# Patient Record
Sex: Female | Born: 1950 | Race: Black or African American | Hispanic: No | Marital: Married | Smoking: Former smoker
Health system: Southern US, Community
[De-identification: ages and names within clinical notes are randomized; demographics above are authoritative.]

---

## 2009-02-26 ENCOUNTER — Encounter: Admission: RE | Admit: 2009-02-26 | Discharge: 2009-02-26 | Payer: Self-pay | Admitting: Neurosurgery

## 2009-02-26 IMAGING — RF DG MYELOGRAM CERVICAL
10 series · 10 of 10 positions shown · IV contrast (omnipaque)
Comparison: Outside MRI from [HOSPITAL] [DATE].

MYELOGRAM  INJECTION
TECHNIQUE: Informed consent was obtained from the patient prior to
the procedure, including potential complications of headache,
allergy, infection and pain. Specific instructions were given
regarding 24 hour bedrest post procedure to prevent post-LP
headache.  A timeout procedure was performed.  With the patient
prone, the lower back was prepped with Betadine.  1% Lidocaine was
used for local anesthesia.  Lumbar puncture was performed by the
radiologist at the L2-[G9] using a 22 gauge needle with return of
clear CSF.  Ninecc of Omnipaque [G9] injected into the
subarachnoid space .
CLINICAL DATA: Neck and right shoulder and arm pain. On the job
injury.  Diminished range of motion.
TECHNIQUE: Multidetector CT imaging of the cervical spine was
performed following myelography.  Multiplanar CT image
reconstructions were also generated.

[Series 1: myelogram  white · 1 of 1 slices shown (1 of 10)]
[im 1/1]
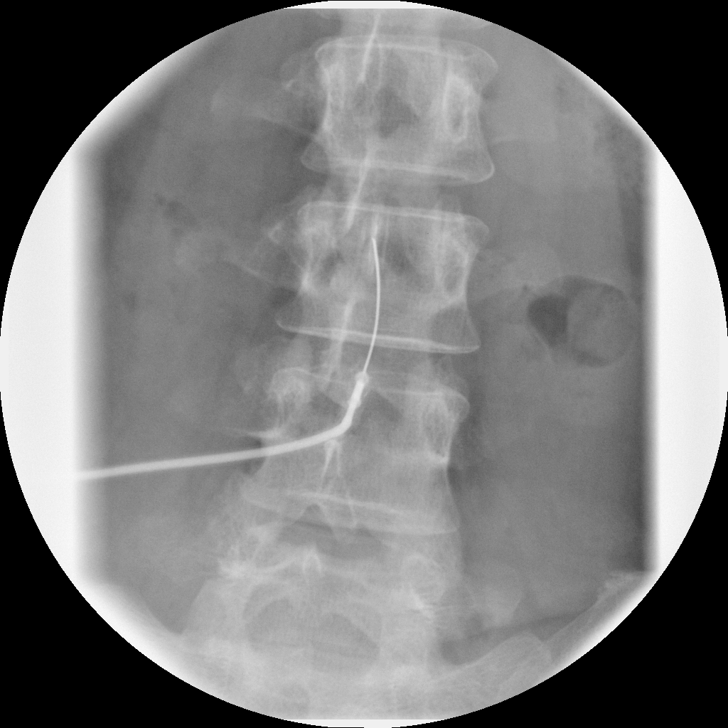

[Series 2: myelogram  white · 1 of 1 slices shown (2 of 10)]
[im 1/1]
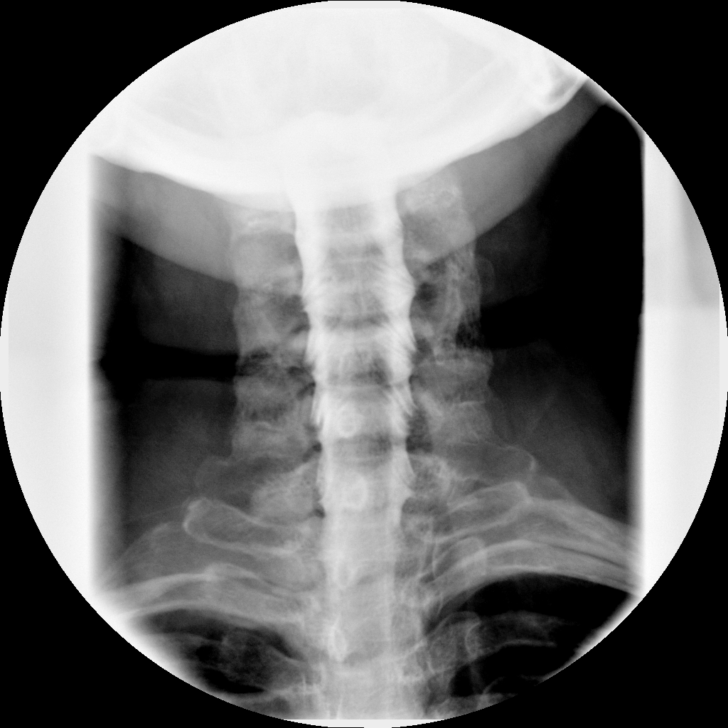

[Series 3: myelogram  white · 1 of 1 slices shown (3 of 10)]
[im 1/1]
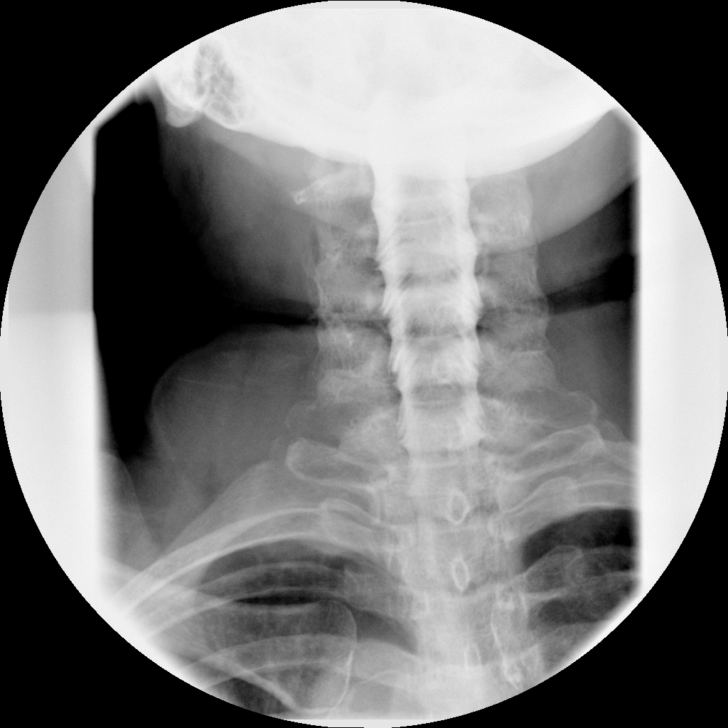

[Series 4: myelogram  white · 1 of 1 slices shown (4 of 10)]
[im 1/1]
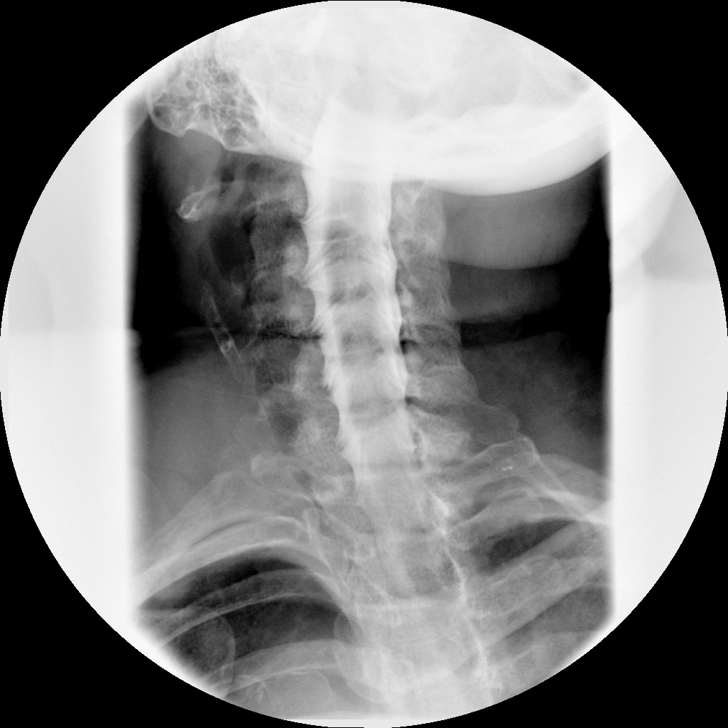

[Series 5: myelogram  white · 1 of 1 slices shown (5 of 10)]
[im 1/1]
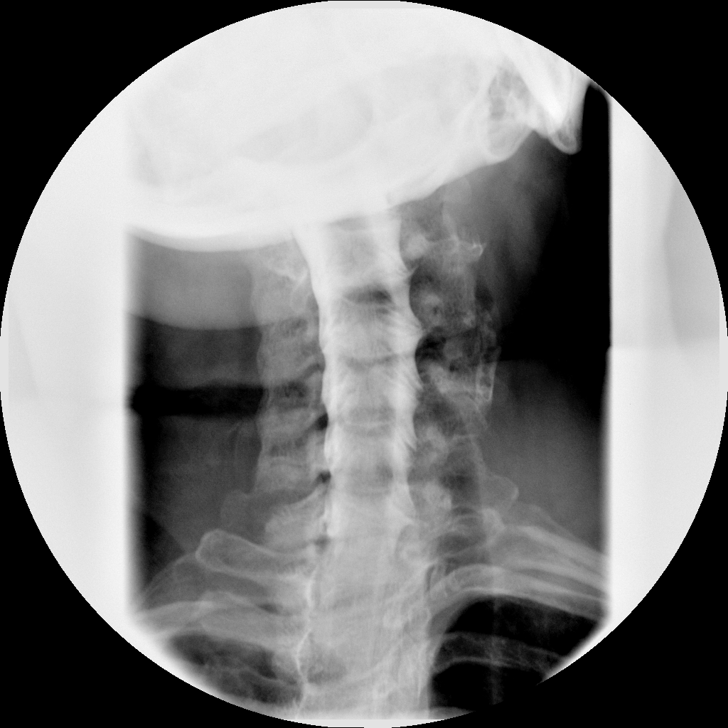

[Series 6: myelogram  white · 1 of 1 slices shown (6 of 10)]
[im 1/1]
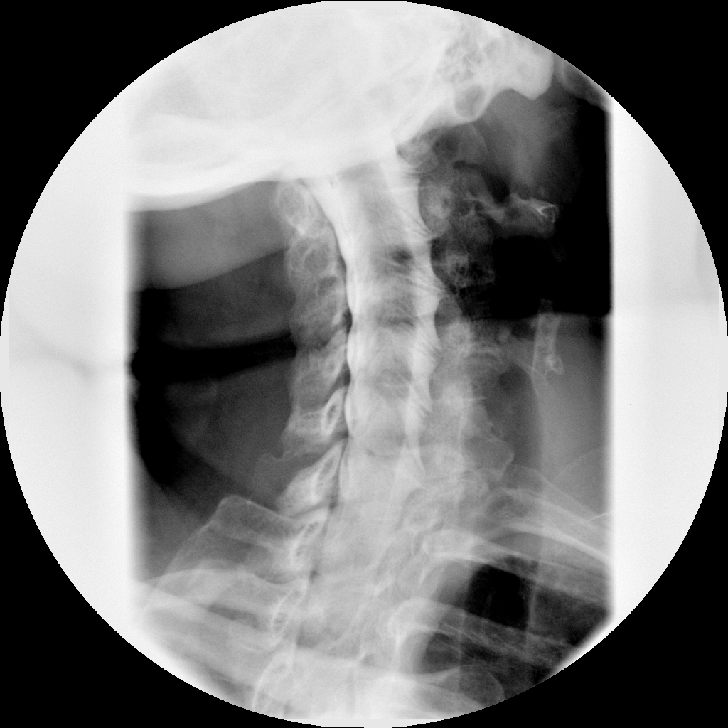

[Series 7: myelogram  white · 1 of 1 slices shown (7 of 10)]
[im 1/1]
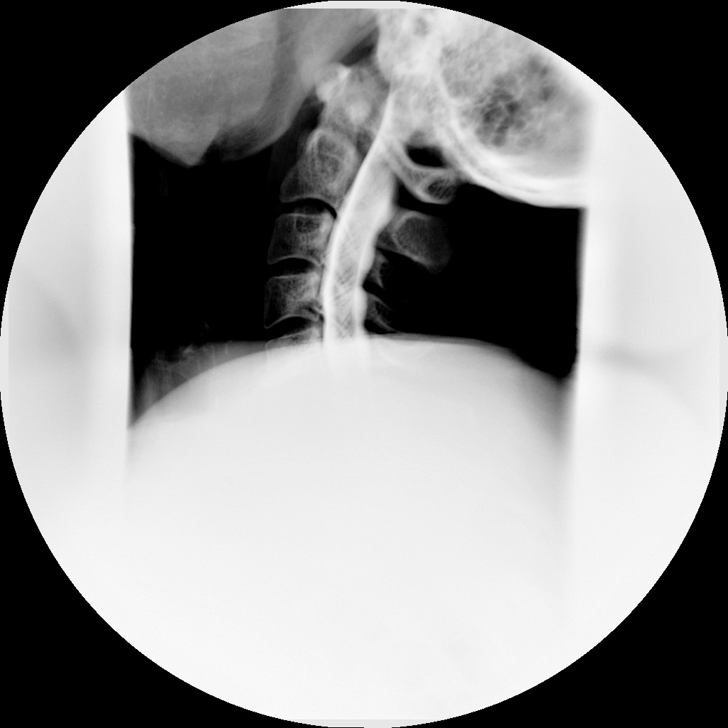

[Series 8: myelogram  white · 1 of 1 slices shown (8 of 10)]
[im 1/1]
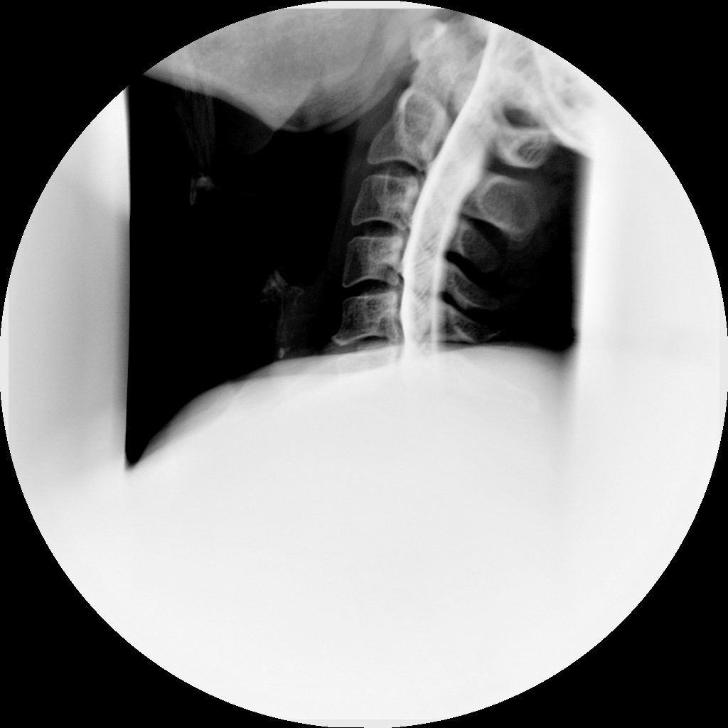

[Series 9: myelogram  white · 1 of 1 slices shown (9 of 10)]
[im 1/1]
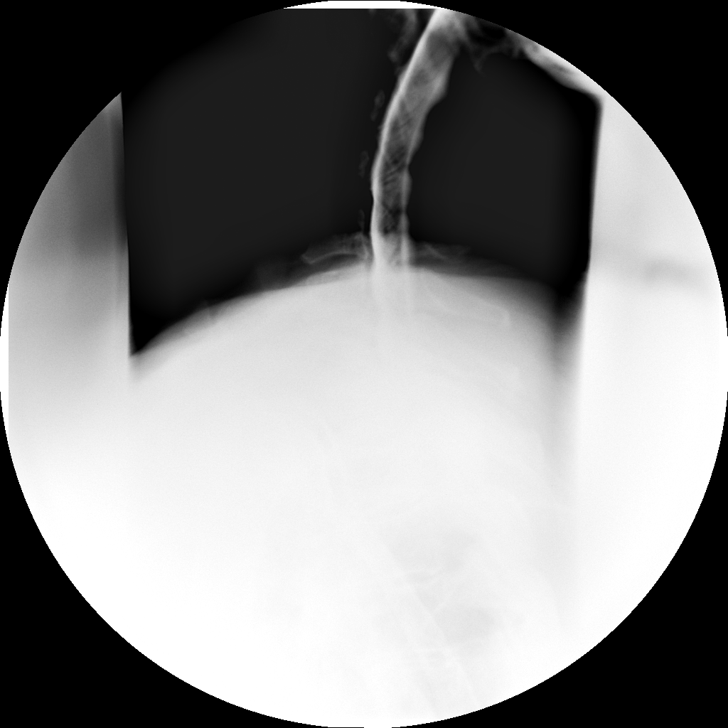

[Series 10: myelogram  white · 1 of 1 slices shown (10 of 10)]
[im 1/1]
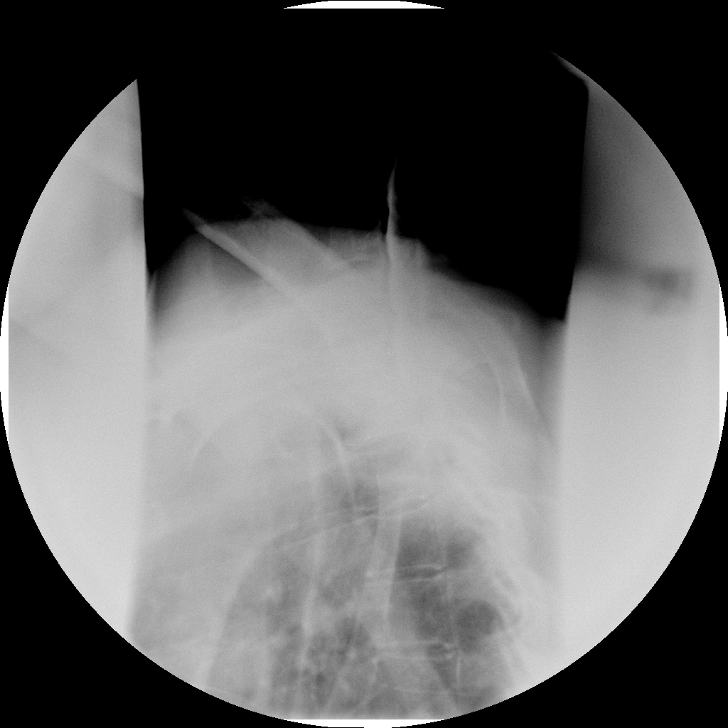

[10 of 10 positions shown; findings below may reference images not displayed]

IMPRESSION: Successful injection of  intrathecal contrast for myelography.

MYELOGRAM CERVICAL
FINDINGS: Lumbar puncture was accomplished without difficulty.
Contrast was maneuvered into the cervical subarachnoid space.  AP,
lateral, and oblique views show mild spondylosis at C6-7 with a
shallow ventral defect.  There is no nerve root cut off and no
spinal stenosis.

Fluoroscopy Time: 1.13 minutes
IMPRESSION: As above

CT MYELOGRAPHY CERVICAL SPINE
The individual disc spaces were examined as follows:

C2-3:  Negative.  No stenosis or disc protrusion.

C3-4:  Negative.  No stenosis or disc protrusion.

C4-5:  Negative.  No stenosis or disc protrusion.

C5-6:  Mild bulge.  No stenosis or disc protrusion.

C6-7:  Disc space narrowing with central osteophyte formation.
Mild annular bulging.  Mild effacement of the anterior subarachnoid
space.  Asymmetric uncinate spurring on the right.  Equivocal right
C7 nerve root encroachment.

C7-T1:  Normal.

No significant facet arthropathy is present.  The cord is normal
size and signal throughout.  There is no tonsillar herniation.

Correlation with prior MR study is difficult.  Overall image
quality is marginal.  No gross interval change from prior study.
IMPRESSION: Mild spondylosis at C5-6 to C6-7 without soft disc protrusion.

Asymmetric uncinate spurring at C6-7, right could potentially
affect the right C7 nerve root.  Correlate clinically.

## 2009-02-26 IMAGING — CT CT CERVICAL SPINE W/ CM
5 of 6 series · 17 of 34 positions shown, 18 images · IV contrast (omnipaque)
Comparison: Outside MRI from [HOSPITAL] [DATE].

MYELOGRAM  INJECTION
TECHNIQUE: Informed consent was obtained from the patient prior to
the procedure, including potential complications of headache,
allergy, infection and pain. Specific instructions were given
regarding 24 hour bedrest post procedure to prevent post-LP
headache.  A timeout procedure was performed.  With the patient
prone, the lower back was prepped with Betadine.  1% Lidocaine was
used for local anesthesia.  Lumbar puncture was performed by the
radiologist at the L2-[G9] using a 22 gauge needle with return of
clear CSF.  Ninecc of Omnipaque [G9] injected into the
subarachnoid space .
CLINICAL DATA: Neck and right shoulder and arm pain. On the job
injury.  Diminished range of motion.
TECHNIQUE: Multidetector CT imaging of the cervical spine was
performed following myelography.  Multiplanar CT image
reconstructions were also generated.

[Series 2: cervical spine · axial · 0.23mm/px · z∈[-158,-94]mm · 2 of 154 slices shown]
[im 52/154  bone]
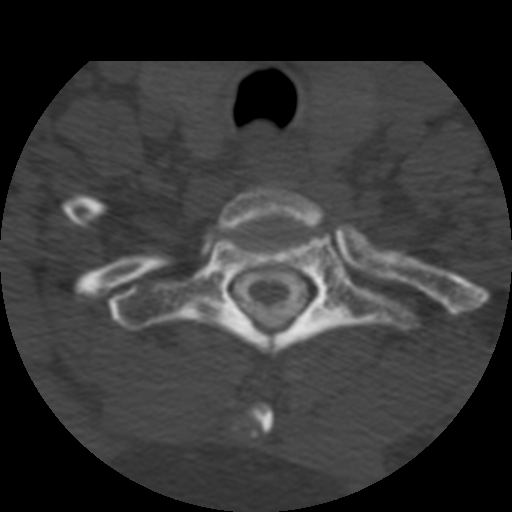
[im 103/154  bone]
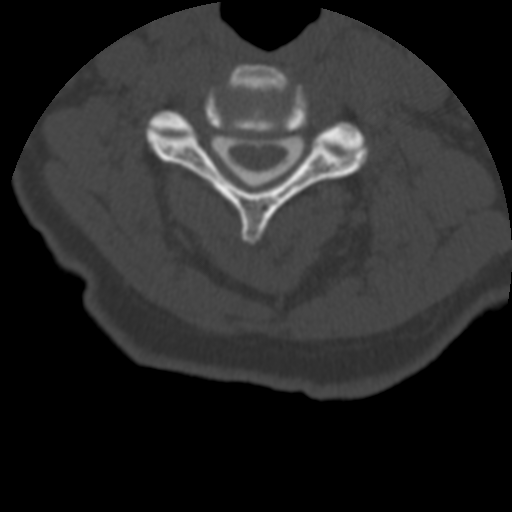

[Series 3: bone windows · axial · 0.23mm/px · z∈[-174,-79]mm · 3 of 154 slices shown, 4 images]
[im 39/154  soft-tissue]
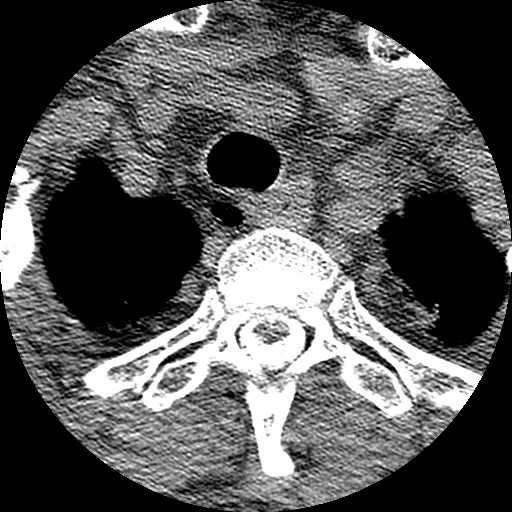
[im 39/154  bone]
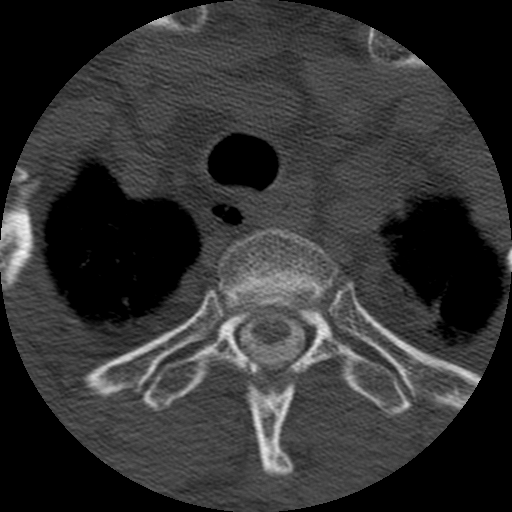
[im 77/154  bone]
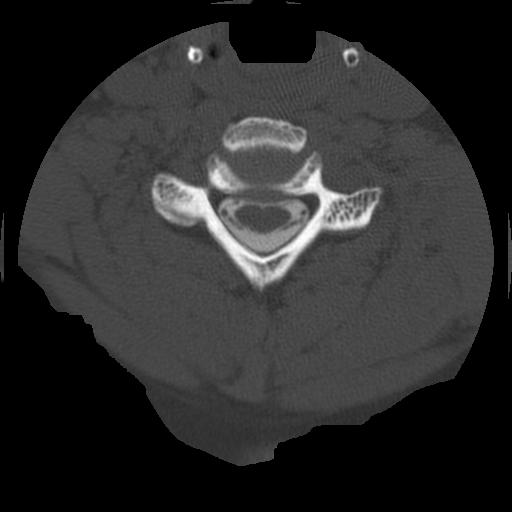
[im 115/154  bone]
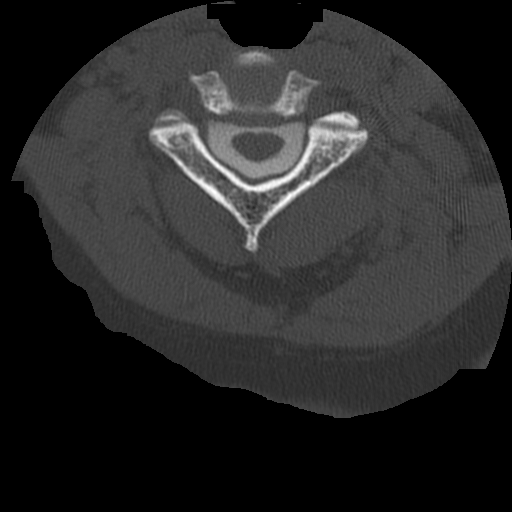

[Series 400: cor upper c-spine · coronal · 0.38mm/px · 6 of 40 slices shown]
[im 2/40  soft-tissue]
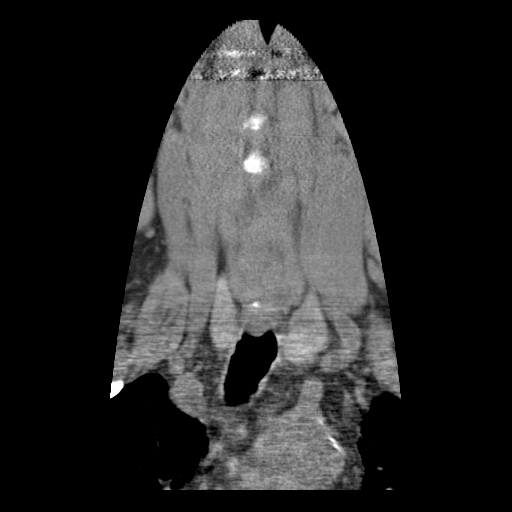
[im 7/40  bone]
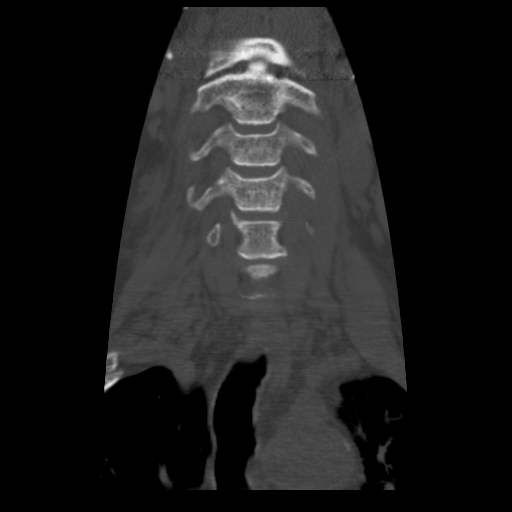
[im 14/40  bone]
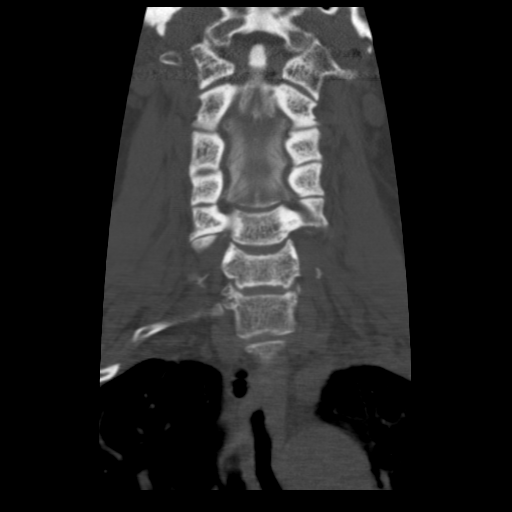
[im 20/40  bone]
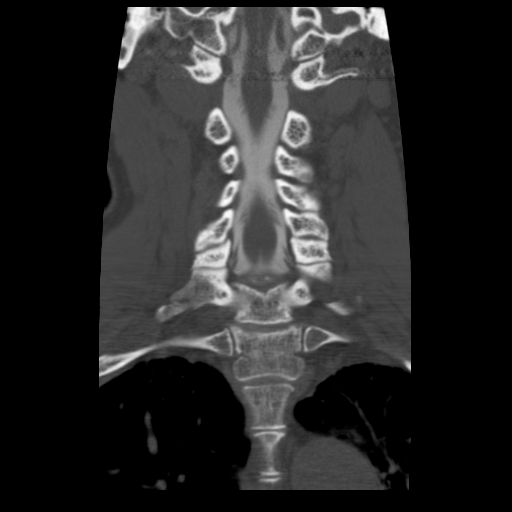
[im 27/40  bone]
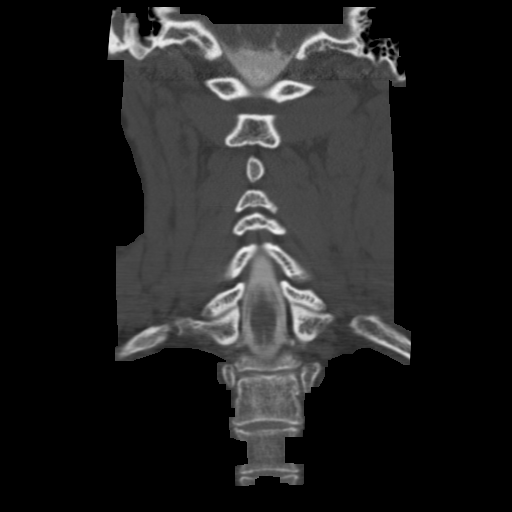
[im 33/40  bone]
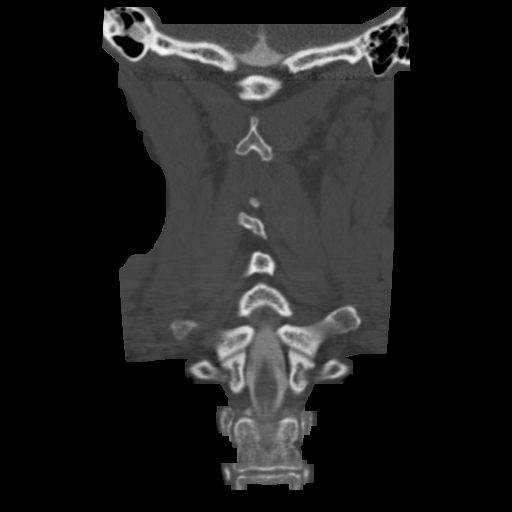

[Series 401: cor lower c-spine · coronal · 0.38mm/px · 3 of 40 slices shown]
[im 8/40  bone]
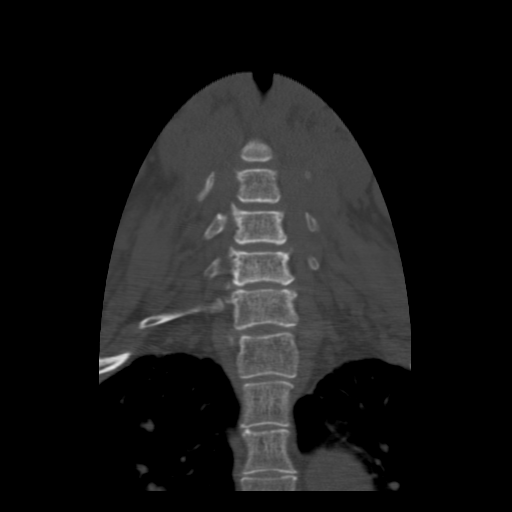
[im 16/40  bone]
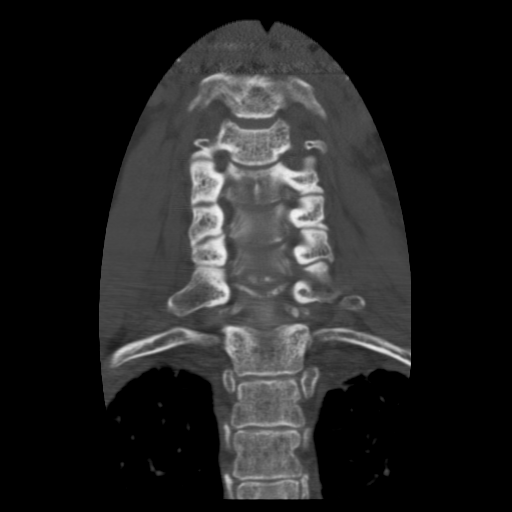
[im 24/40  bone]
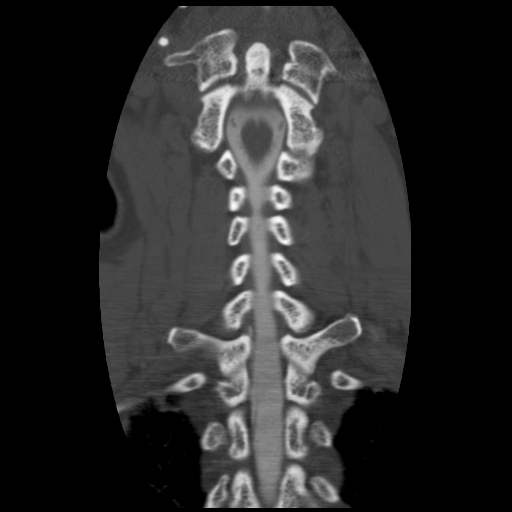

[Series 403: axial c-spine · axial · 0.25mm/px · z∈[-185,-99]mm · 3 of 162 slices shown]
[im 41/162  bone]
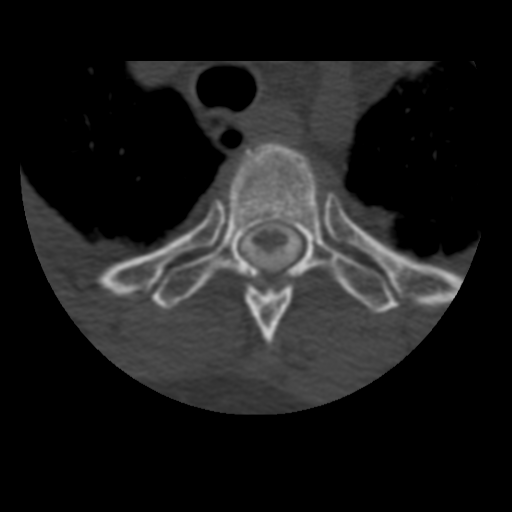
[im 81/162  bone]
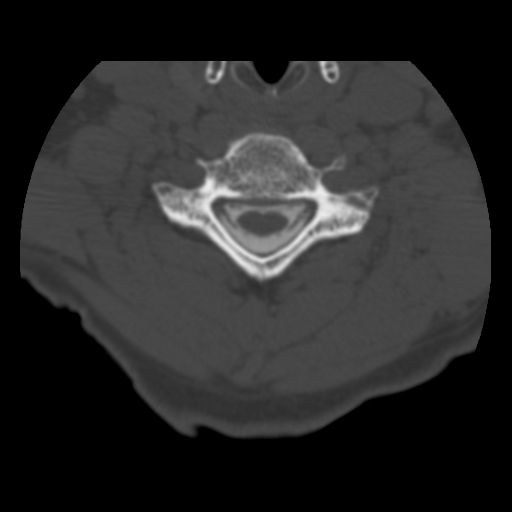
[im 121/162  bone]
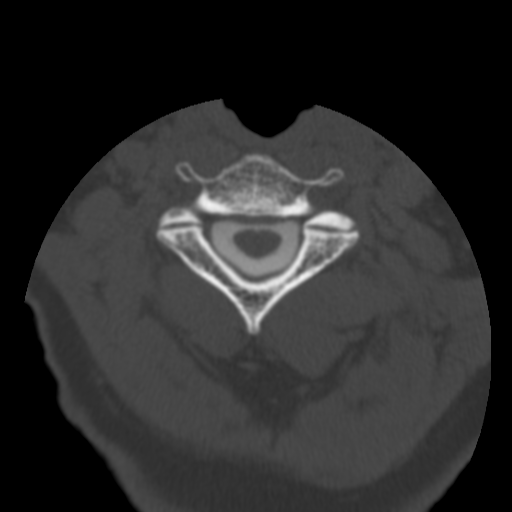

[17 of 34 positions shown; findings below may reference images not displayed]

IMPRESSION: Successful injection of  intrathecal contrast for myelography.

MYELOGRAM CERVICAL
FINDINGS: Lumbar puncture was accomplished without difficulty.
Contrast was maneuvered into the cervical subarachnoid space.  AP,
lateral, and oblique views show mild spondylosis at C6-7 with a
shallow ventral defect.  There is no nerve root cut off and no
spinal stenosis.

Fluoroscopy Time: 1.13 minutes
IMPRESSION: As above

CT MYELOGRAPHY CERVICAL SPINE
The individual disc spaces were examined as follows:

C2-3:  Negative.  No stenosis or disc protrusion.

C3-4:  Negative.  No stenosis or disc protrusion.

C4-5:  Negative.  No stenosis or disc protrusion.

C5-6:  Mild bulge.  No stenosis or disc protrusion.

C6-7:  Disc space narrowing with central osteophyte formation.
Mild annular bulging.  Mild effacement of the anterior subarachnoid
space.  Asymmetric uncinate spurring on the right.  Equivocal right
C7 nerve root encroachment.

C7-T1:  Normal.

No significant facet arthropathy is present.  The cord is normal
size and signal throughout.  There is no tonsillar herniation.

Correlation with prior MR study is difficult.  Overall image
quality is marginal.  No gross interval change from prior study.
IMPRESSION: Mild spondylosis at C5-6 to C6-7 without soft disc protrusion.

Asymmetric uncinate spurring at C6-7, right could potentially
affect the right C7 nerve root.  Correlate clinically.

## 2020-09-25 ENCOUNTER — Other Ambulatory Visit: Payer: Self-pay | Admitting: Neurological Surgery

## 2020-09-25 DIAGNOSIS — E236 Other disorders of pituitary gland: Secondary | ICD-10-CM

## 2020-10-17 ENCOUNTER — Other Ambulatory Visit: Payer: Self-pay

## 2020-10-17 ENCOUNTER — Ambulatory Visit
Admission: RE | Admit: 2020-10-17 | Discharge: 2020-10-17 | Disposition: A | Discharge status: Home / Self Care | Payer: Medicare PPO | Source: Ambulatory Visit | Attending: Neurological Surgery | Admitting: Neurological Surgery

## 2020-10-17 DIAGNOSIS — E236 Other disorders of pituitary gland: Secondary | ICD-10-CM

## 2020-10-17 IMAGING — MR MR HEAD WO/W CM
12 series · 48 of 48 positions shown · IV contrast (multihance)
Comparison: None.

CLINICAL DATA: Head pain

EXAM:
MRI HEAD WITHOUT AND WITH CONTRAST
TECHNIQUE: Multiplanar, multiecho pulse sequences of the brain and surrounding
structures were obtained without and with intravenous contrast.
CONTRAST:  20mL MULTIHANCE GADOBENATE DIMEGLUMINE 529 MG/ML IV SOLN

[Series 5: T1 · sagittal · 4.0mm · 0.94mm/px · 1 of 31 slices shown (1 of 3)]
[im 1/31]
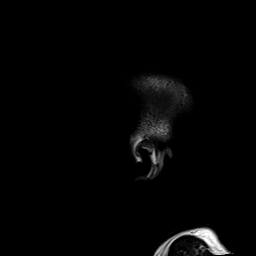

[Series 6: DWI · axial · 3.0mm · 1.44mm/px · z∈[-59,+76]mm · 4 of 84 slices shown (1 of 4)]
[im 1/84]
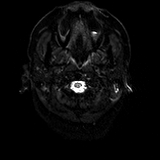
[im 28/84]
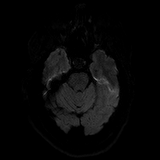
[im 56/84]
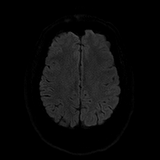
[im 84/84]
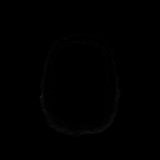

[Series 7: DWI · axial · 3.0mm · 1.44mm/px · z∈[-59,+76]mm · 3 of 42 slices shown (2 of 4)]
[im 1/42]
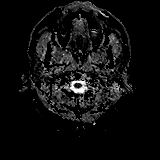
[im 21/42]
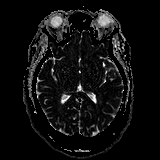
[im 42/42]
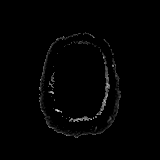

[Series 8: DWI · coronal · 5.0mm · 1.44mm/px · 4 of 60 slices shown (3 of 4)]
[im 1/60]
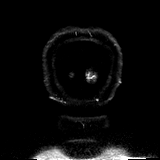
[im 20/60]
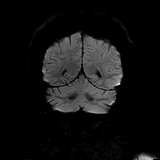
[im 40/60]
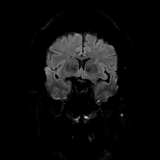
[im 60/60]
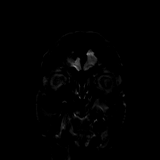

[Series 9: DWI · coronal · 5.0mm · 1.44mm/px · 2 of 30 slices shown (4 of 4)]
[im 1/30]
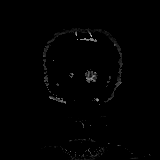
[im 30/30]
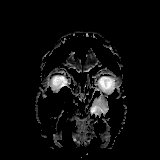

[Series 10: T2 · axial · 4.0mm · 0.36mm/px · z∈[-62,+73]mm · 2 of 27 slices shown]
[im 1/27]
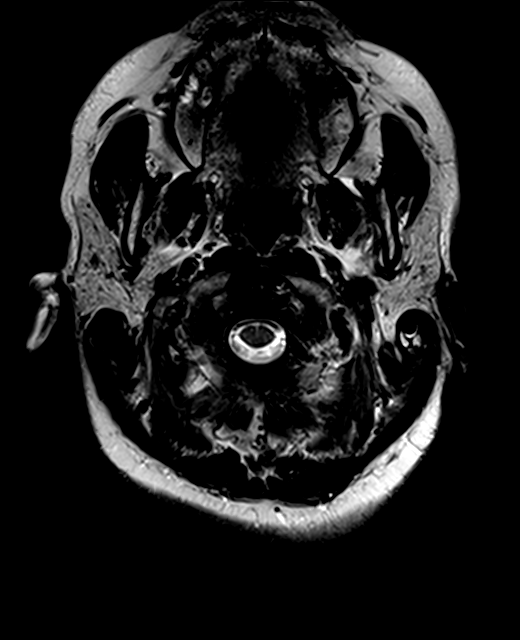
[im 27/27]
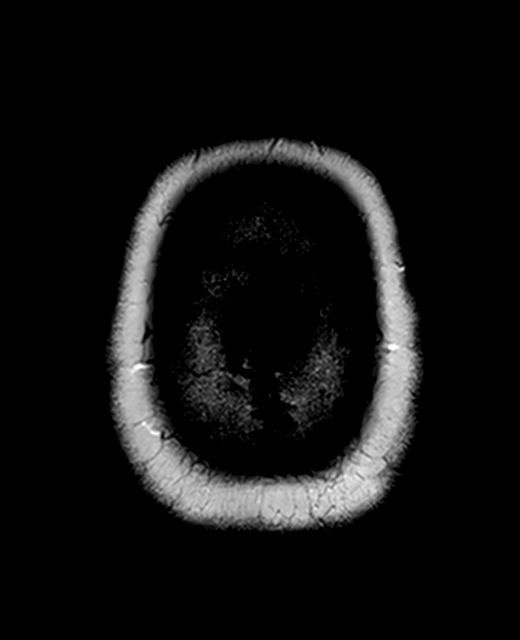

[Series 11: FLAIR · axial · 3.0mm · 0.72mm/px · z∈[-68,+82]mm · 2 of 26 slices shown]
[im 1/26]
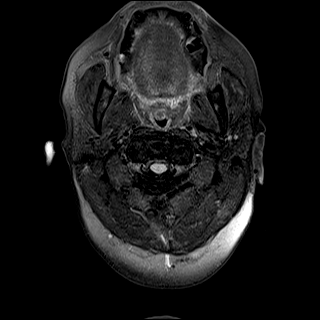
[im 26/26]
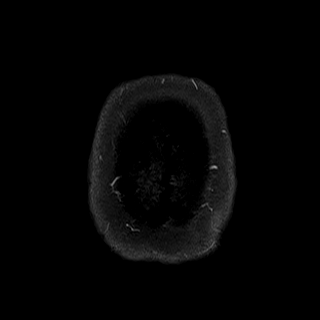

[Series 13: swi_images · axial · 1.5mm · 0.90mm/px · z∈[-66,+77]mm · 6 of 96 slices shown]
[im 1/96]
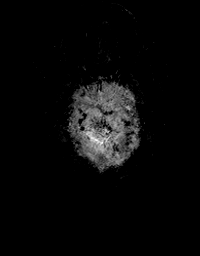
[im 20/96]
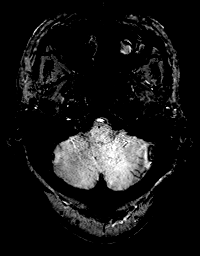
[im 39/96]
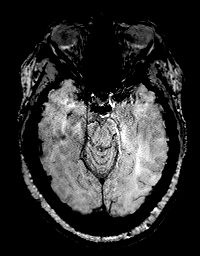
[im 58/96]
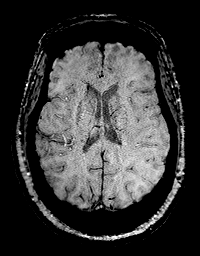
[im 77/96]
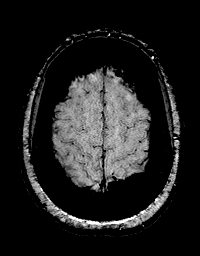
[im 96/96]
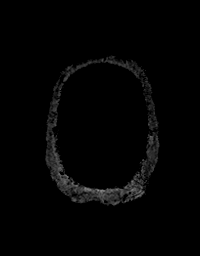

[Series 14: T1 · axial · 1.0mm · 0.94mm/px · z∈[-77,+82]mm · 10 of 160 slices shown (2 of 3)]
[im 1/160]
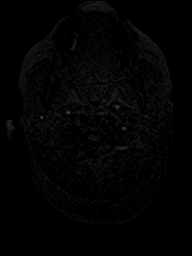
[im 18/160]
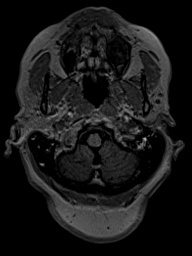
[im 36/160]
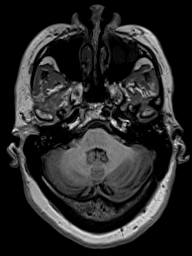
[im 54/160]
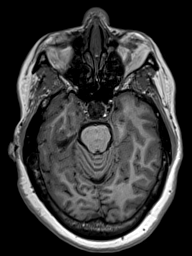
[im 71/160]
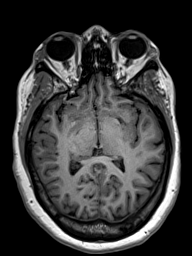
[im 89/160]
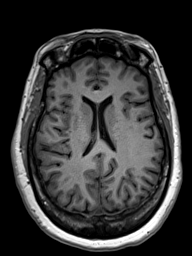
[im 107/160]
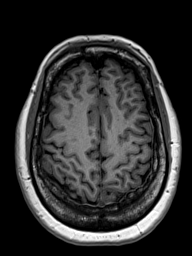
[im 124/160]
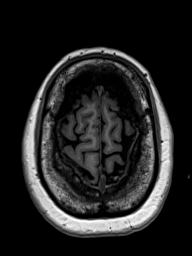
[im 142/160]
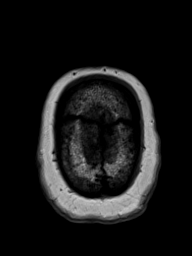
[im 160/160]
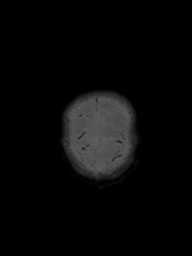

[Series 15: T2 post-contrast · coronal · 4.5mm · 0.36mm/px · 2 of 35 slices shown]
[im 1/35]
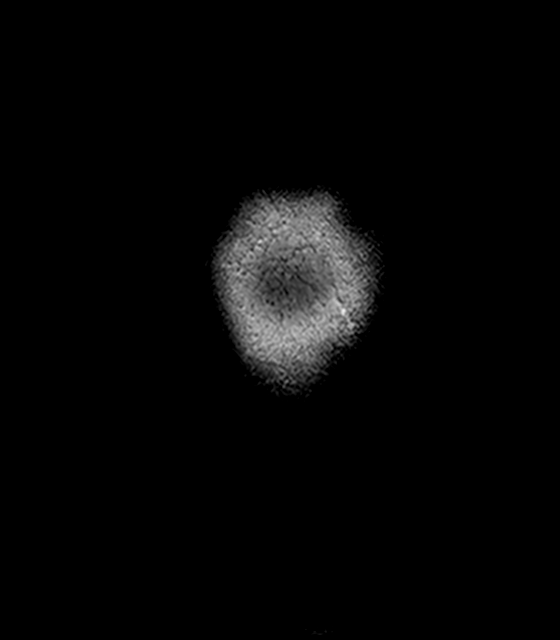
[im 35/35]
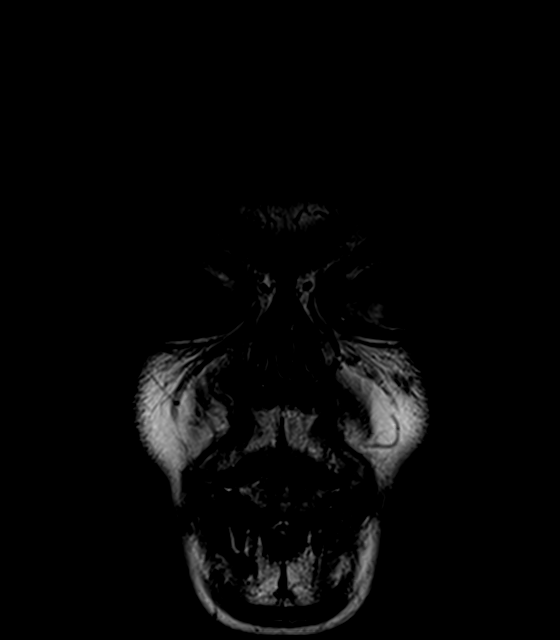

[Series 16: T1 · axial · 1.0mm · 0.94mm/px · z∈[-77,+82]mm · 10 of 160 slices shown (3 of 3)]
[im 1/160]
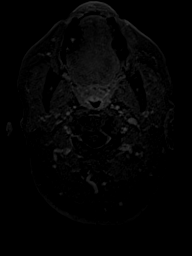
[im 18/160]
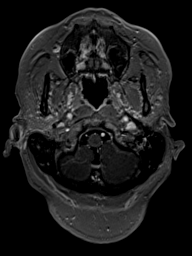
[im 36/160]
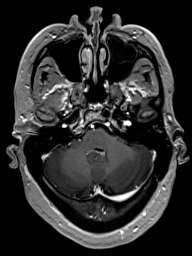
[im 54/160]
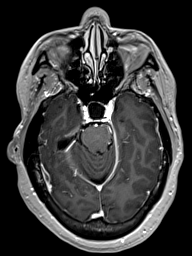
[im 71/160]
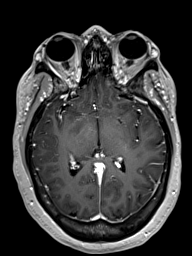
[im 89/160]
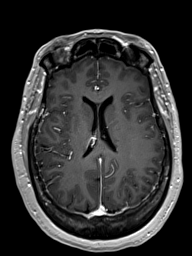
[im 107/160]
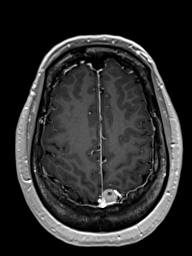
[im 124/160]
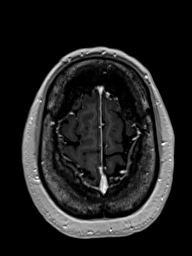
[im 142/160]
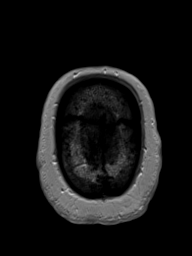
[im 160/160]
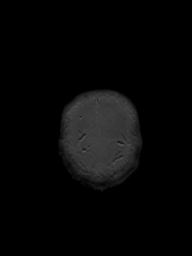

[Series 17: T1 post-contrast · coronal · 4.0mm · 0.72mm/px · 2 of 32 slices shown]
[im 1/32]
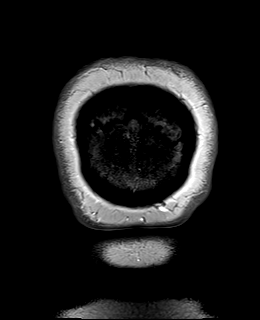
[im 32/32]
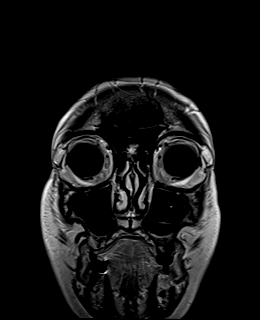

[48 of 48 positions shown; findings below may reference images not displayed]

FINDINGS: Brain: Ventricle size and cerebral volume normal. Negative for acute
infarct. Scattered small subcortical and deep white matter
hyperintensities bilaterally appear chronic. Negative for
hemorrhage. Normal brainstem and cerebellum.

13 mm enhancing extra-axial mass over the left parietal convexity
compatible with meningioma. This is adjacent to the falx. No brain
edema.

Empty sella. Sella is enlarged and filled with CSF. No definite
pituitary tissue identified. To the left of the sella, there is a
fluid-filled cyst in the central skull base measuring 13 mm. This
does not enhance. Most likely meningocele.

Vascular: Normal arterial flow voids

Skull and upper cervical spine: Diffuse thickening of the calvarium
without focal abnormality.

Sinuses/Orbits: Retention cyst left maxillary sinus. Mild mastoid
effusion bilaterally. Negative orbit

Other: None
IMPRESSION: 1. 13 mm left parietal para falcine meningioma without brain edema.
2. Mild deep white matter hyperintensities likely due to chronic
microvascular ischemia or chronic migraine headache.
3. Empty sella. Probable meningocele in the central skull base to
the left of the sella. These findings raise the possibility of
intracranial hypertension. Correlate with symptoms.

## 2020-10-17 MED ORDER — GADOBENATE DIMEGLUMINE 529 MG/ML IV SOLN
20.0000 mL | Freq: Once | INTRAVENOUS | Status: AC | PRN
  Administered 2020-10-17: 20 mL via INTRAVENOUS

## 2021-09-28 ENCOUNTER — Other Ambulatory Visit: Payer: Self-pay | Admitting: Neurological Surgery

## 2021-09-28 DIAGNOSIS — D320 Benign neoplasm of cerebral meninges: Secondary | ICD-10-CM

## 2021-10-28 ENCOUNTER — Other Ambulatory Visit: Payer: Self-pay | Admitting: Neurological Surgery

## 2021-10-28 ENCOUNTER — Other Ambulatory Visit (HOSPITAL_COMMUNITY): Payer: Self-pay | Admitting: Neurological Surgery

## 2021-10-28 DIAGNOSIS — D320 Benign neoplasm of cerebral meninges: Secondary | ICD-10-CM

## 2021-11-06 ENCOUNTER — Ambulatory Visit (HOSPITAL_COMMUNITY): Payer: Medicare PPO

## 2021-11-15 ENCOUNTER — Other Ambulatory Visit: Payer: Self-pay

## 2021-11-15 ENCOUNTER — Ambulatory Visit (HOSPITAL_COMMUNITY)
Admission: RE | Admit: 2021-11-15 | Discharge: 2021-11-15 | Disposition: A | Discharge status: Home / Self Care | Payer: Medicare PPO | Source: Ambulatory Visit | Attending: Neurological Surgery | Admitting: Neurological Surgery

## 2021-11-15 DIAGNOSIS — D320 Benign neoplasm of cerebral meninges: Secondary | ICD-10-CM | POA: Diagnosis present

## 2021-11-15 IMAGING — MR MR HEAD WO/W CM
14 of 16 series · 40 of 48 positions shown · IV contrast (gadavist)
Comparison: [DATE]

CLINICAL DATA: Meningioma follow-up

EXAM:
MRI HEAD WITHOUT AND WITH CONTRAST
TECHNIQUE: Multiplanar, multiecho pulse sequences of the brain and surrounding
structures were obtained without and with intravenous contrast.
CONTRAST:  10mL GADAVIST GADOBUTROL 1 MMOL/ML IV SOLN

[Series 5: DWI · axial · 3.0mm · 0.88mm/px · z∈[-73,+67]mm · 5 of 96 slices shown (1 of 4)]
[im 1/96]
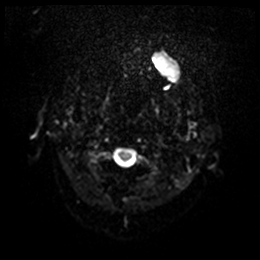
[im 24/96]
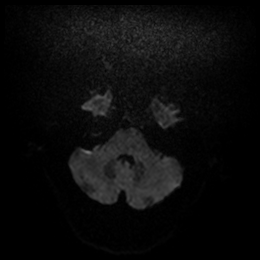
[im 48/96]
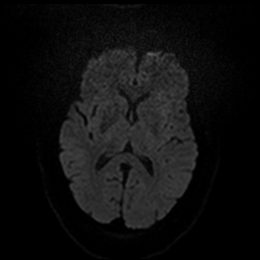
[im 72/96]
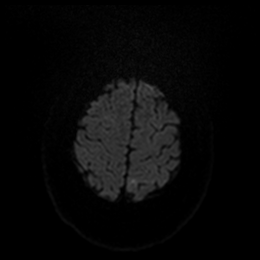
[im 96/96]
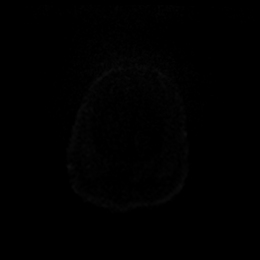

[Series 6: DWI · axial · 3.0mm · 0.88mm/px · z∈[-73,+67]mm · 2 of 48 slices shown (2 of 4)]
[im 1/48]
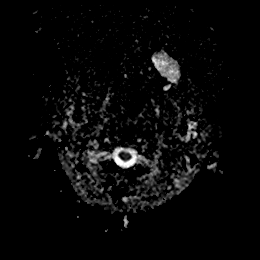
[im 48/48]
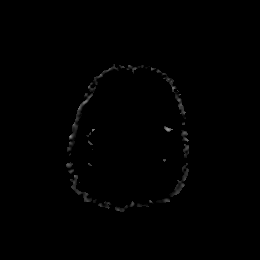

[Series 7: DWI · coronal · 4.0mm · 0.88mm/px · 4 of 69 slices shown (3 of 4)]
[im 1/69]
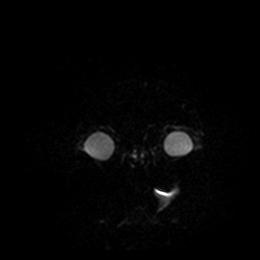
[im 23/69]
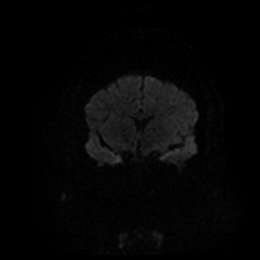
[im 46/69]
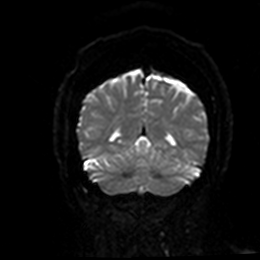
[im 69/69]
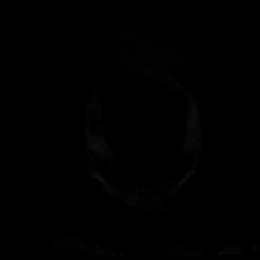

[Series 8: DWI · coronal · 4.0mm · 0.88mm/px · 2 of 35 slices shown (4 of 4)]
[im 1/35]
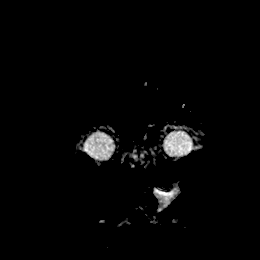
[im 35/35]
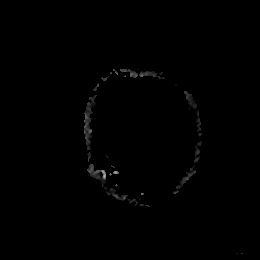

[Series 9: T1 · sagittal · 5.0mm · 0.75mm/px · 2 of 25 slices shown]
[im 1/25]
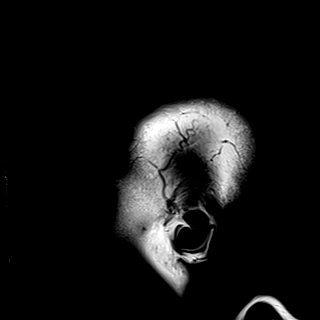
[im 25/25]
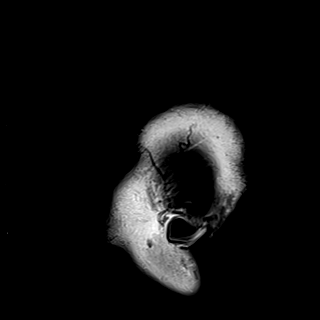

[Series 10: T2 · axial · 5.0mm · 0.72mm/px · z∈[-75,+68]mm · 2 of 25 slices shown]
[im 1/25]
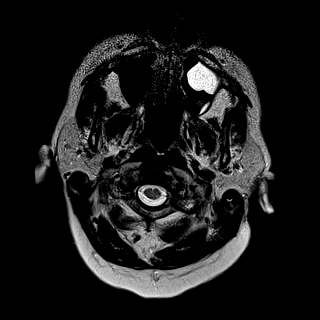
[im 25/25]
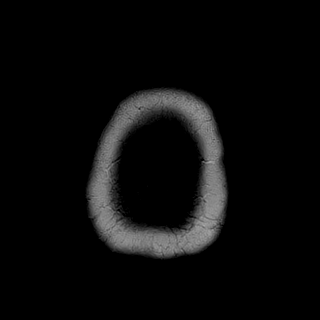

[Series 11: FLAIR · axial · 5.0mm · 0.45mm/px · z∈[-77,+66]mm · 2 of 25 slices shown]
[im 1/25]
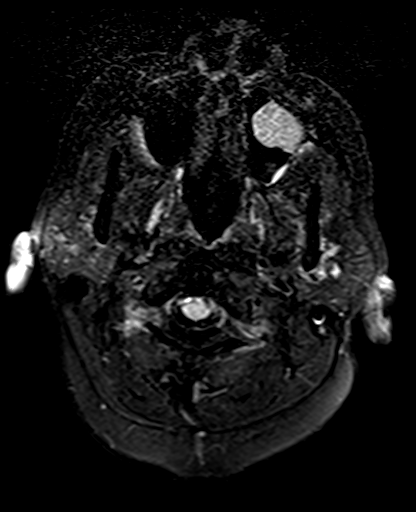
[im 25/25]
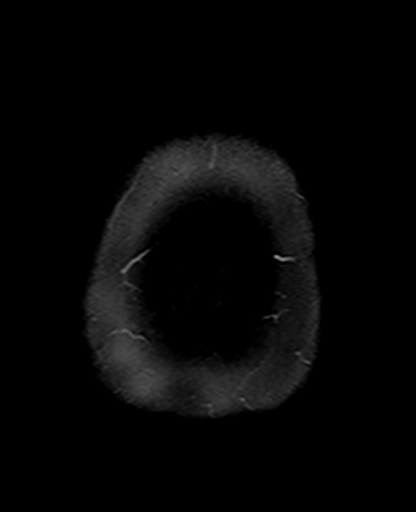

[Series 12: mag_images · axial · 3.0mm · 0.90mm/px · z∈[-82,+71]mm · 4 of 52 slices shown]
[im 1/52]
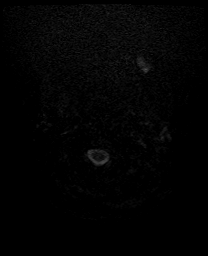
[im 18/52]
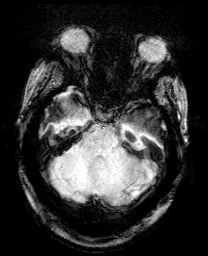
[im 35/52]
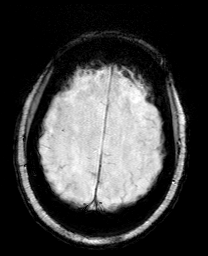
[im 52/52]
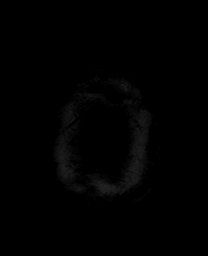

[Series 13: pha_images · axial · 3.0mm · 0.90mm/px · z∈[-82,+71]mm · 4 of 52 slices shown]
[im 1/52]
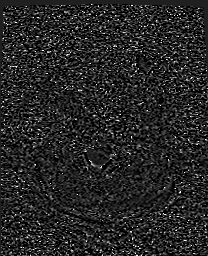
[im 18/52]
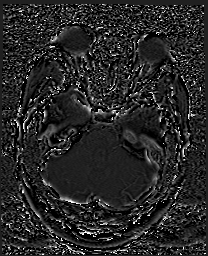
[im 35/52]
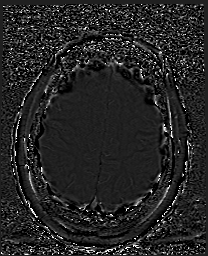
[im 52/52]
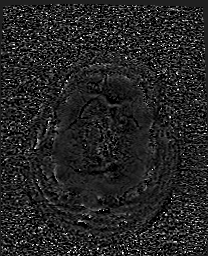

[Series 14: swi_images · axial · 3.0mm · 0.90mm/px · z∈[-82,+71]mm · 4 of 52 slices shown]
[im 1/52]
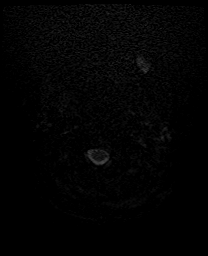
[im 18/52]
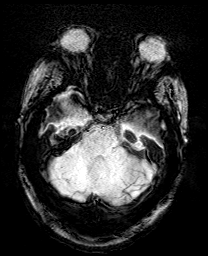
[im 35/52]
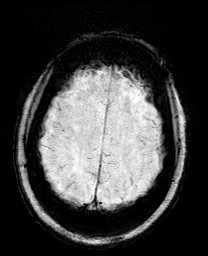
[im 52/52]
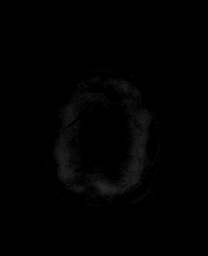

[Series 15: mip_images(sw) · axial · 24.0mm · 0.90mm/px · z∈[-71,+60]mm · 3 of 45 slices shown]
[im 1/45]
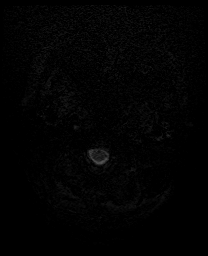
[im 23/45]
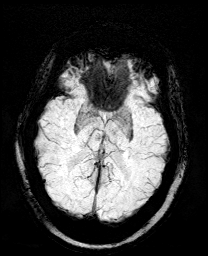
[im 45/45]
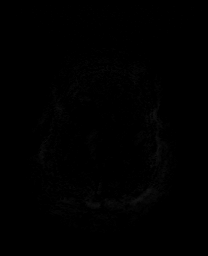

[Series 17: T2 post-contrast · coronal · 5.0mm · 0.72mm/px · 2 of 29 slices shown]
[im 1/29]
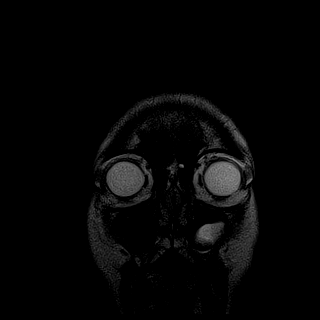
[im 29/29]
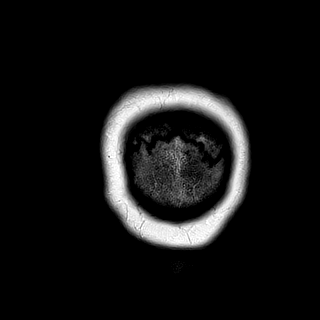

[Series 19: T1 post-contrast · coronal · 5.0mm · 0.34mm/px · 2 of 29 slices shown (1 of 2)]
[im 1/29]
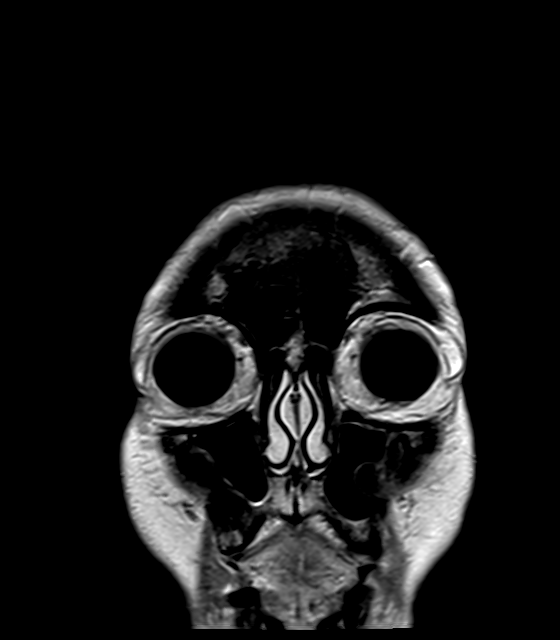
[im 29/29]
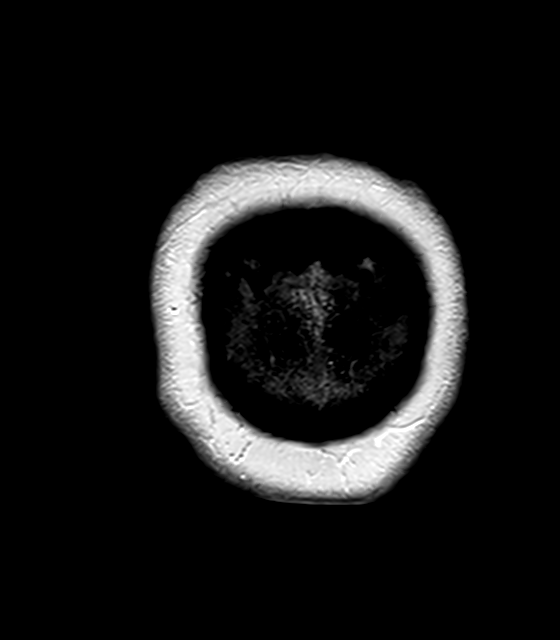

[Series 20: T1 post-contrast · sagittal · 5.0mm · 0.72mm/px · 2 of 25 slices shown (2 of 2)]
[im 1/25]
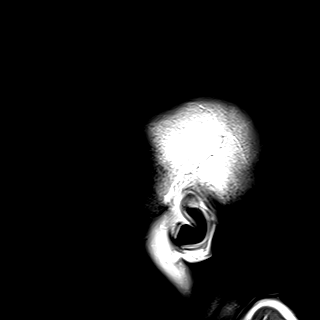
[im 25/25]
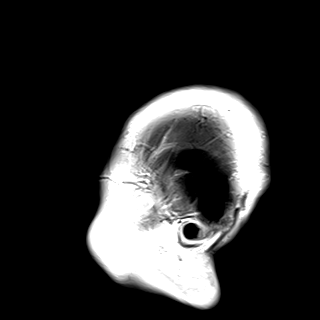

[40 of 48 positions shown; findings below may reference images not displayed]

FINDINGS: Brain: No substantial change in size of a 13 mm meningioma along the
parasagittal left parietal convexity near the vertex allowing for
differences in slice angulation. As before, abuts the superior
sagittal sinus without evidence of compression or invasion. Some
associated susceptibility probably reflects mineralization.

There is no acute infarction or intracranial hemorrhage. There is no
parenchymal mass, significant mass effect, or edema. There is no
hydrocephalus or extra-axial fluid collection. Ventricles and sulci
are normal in size and configuration. Minimal patchy foci of T2
hyperintensity in the supratentorial white matter likely reflect
nonspecific gliosis/demyelination. No new abnormal enhancement.

Vascular: Major vessel flow voids at the skull base are preserved.

Skull and upper cervical spine: Normal marrow signal is preserved.

Sinuses/Orbits: Left maxillary sinus lobular mucosal thickening.
Orbits are unremarkable.

Other: Expanded, "empty" sella. Left petrous apex cephalocele
recesses trapped fluid at the petrous apex. Appearance is unchanged.
Mastoid air cells are clear.
IMPRESSION: No significant change in left parietal convexity meningioma at the
vertex. No edema.

## 2021-11-15 MED ORDER — GADOBUTROL 1 MMOL/ML IV SOLN
10.0000 mL | Freq: Once | INTRAVENOUS | Status: AC | PRN
  Administered 2021-11-15: 10 mL via INTRAVENOUS

## 2022-05-12 ENCOUNTER — Emergency Department (HOSPITAL_COMMUNITY)
Admission: EM | Admit: 2022-05-12 | Discharge: 2022-05-13 | Disposition: A | Discharge status: Home / Self Care | Payer: Medicare PPO | Attending: Emergency Medicine | Admitting: Emergency Medicine

## 2022-05-12 ENCOUNTER — Other Ambulatory Visit: Payer: Self-pay

## 2022-05-12 DIAGNOSIS — L509 Urticaria, unspecified: Secondary | ICD-10-CM | POA: Diagnosis not present

## 2022-05-12 DIAGNOSIS — R21 Rash and other nonspecific skin eruption: Secondary | ICD-10-CM | POA: Diagnosis present

## 2022-05-12 MED ORDER — HYDROXYZINE HCL 25 MG PO TABS
50.0000 mg | ORAL_TABLET | Freq: Once | ORAL | Status: AC
  Administered 2022-05-12: 50 mg via ORAL
  Filled 2022-05-12: qty 2

## 2022-05-12 MED ORDER — METHYLPREDNISOLONE SODIUM SUCC 125 MG IJ SOLR
125.0000 mg | Freq: Once | INTRAMUSCULAR | Status: AC
  Administered 2022-05-12: 125 mg via INTRAVENOUS
  Filled 2022-05-12: qty 2

## 2022-05-12 MED ORDER — FAMOTIDINE IN NACL 20-0.9 MG/50ML-% IV SOLN
20.0000 mg | Freq: Once | INTRAVENOUS | Status: AC
  Administered 2022-05-12: 20 mg via INTRAVENOUS
  Filled 2022-05-12: qty 50

## 2022-05-12 NOTE — ED Provider Notes (Signed)
[facility] Provider Note   CSN: [id number] Arrival date & time: 05/12/22  2207     History {Add pertinent medical, surgical, social history, OB history to HPI:1} Chief Complaint  Patient presents with   Allergic Reaction    Margaret Jefferson is a 71 y.o. female.  HPI     This is a 71 year old female who presents with rash and itching.  Patient reports that onset of symptoms started early this morning.  [place]  She denies any new soaps or detergents.  She describes an itchy rash involving her hands, neck, trunk, and underneath her underwear and bra.  She denies any new soaps.  No new detergents.  Denies any new medications or foods.  Denies throat swelling or lip swelling.  She has had some shortness of breath but states that this is not necessarily abnormal for her.  She states she sometimes gets shortness of breath when she "out does herself."  Patient states she is sleeping in a hotel room but no one else has a rash.  He has taken Benadryl 3 times today with minimal relief.  Home Medications Prior to Admission medications   Not on File      Allergies    Almond oil, Gabapentin, Influenza virus vaccine, Meloxicam, Shrimp extract allergy skin test, Sulfamethoxazole, Trimethoprim, Codeine, and Sulfamethoxazole-trimethoprim    Review of Systems   Review of Systems  Respiratory:  Positive for shortness of breath.   Gastrointestinal:  Negative for abdominal pain, nausea and vomiting.  Skin:  Positive for rash.  All other systems reviewed and are negative.  Physical Exam Updated Vital Signs BP (!) 190/97 (BP Location: Left Arm)   Pulse (!) 108   Temp 98.6 F (37 C) (Oral)   Resp (!) 31   SpO2 99%  Physical Exam Vitals and nursing note reviewed.  Constitutional:      Appearance: She is well-developed. She is obese. She is not ill-appearing.     Comments: ABCs intact  HENT:     Head: Normocephalic  and atraumatic.     Mouth/Throat:     Comments: No oropharyngeal swelling noted Eyes:     Pupils: Pupils are equal, round, and reactive to light.  Cardiovascular:     Rate and Rhythm: Normal rate and regular rhythm.     Heart sounds: Normal heart sounds.  Pulmonary:     Effort: Pulmonary effort is normal. No respiratory distress.     Breath sounds: No wheezing.  Abdominal:     Palpations: Abdomen is soft.     Tenderness: There is no abdominal tenderness.  Musculoskeletal:     Cervical back: Neck supple.  Skin:    General: Skin is warm and dry.     Comments: Scattered hives noted over the skin including right greater than left hands, neck, under the bra line, involving the skin underneath her underwear specifically the mons pubis and inguinal region, no crepitus or warmth associated  Neurological:     Mental Status: She is alert and oriented to person, place, and time.  Psychiatric:        Mood and Affect: Mood normal.    ED Results / Procedures / Treatments   Labs (all labs ordered are listed, but only abnormal results are displayed) Labs Reviewed - No data to display  EKG None  Radiology No results found.  Procedures Procedures  {Document cardiac monitor, telemetry assessment procedure when appropriate:1}  Medications Ordered in  ED Medications  methylPREDNISolone sodium succinate (SOLU-MEDROL) 125 mg/2 mL injection 125 mg (has no administration in time range)  hydrOXYzine (ATARAX) tablet 50 mg (has no administration in time range)  famotidine (PEPCID) IVPB 20 mg premix (has no administration in time range)    ED Course/ Medical Decision Making/ A&P                           Medical Decision Making Risk Prescription drug management.   ***  {Document critical care time when appropriate:1} {Document review of labs and clinical decision tools ie heart score, Chads2Vasc2 etc:1}  {Document your independent review of radiology images, and any outside  records:1} {Document your discussion with family members, caretakers, and with consultants:1} {Document social determinants of health affecting pt's care:1} {Document your decision making why or why not admission, treatments were needed:1} Final Clinical Impression(s) / ED Diagnoses Final diagnoses:  None    Rx / DC Orders ED Discharge Orders     None

## 2022-05-12 NOTE — ED Triage Notes (Signed)
Pt presents with swelling and itching to her right hand, hives to her neck and abdomen. States rash started this morning, but has developed some shortness of breath.

## 2022-05-13 MED ORDER — DIPHENHYDRAMINE HCL 25 MG PO TABS: 25.0000 mg | ORAL_TABLET | ORAL | 0 refills | Status: AC | PRN

## 2022-05-13 MED ORDER — DIPHENHYDRAMINE HCL 50 MG/ML IJ SOLN
25.0000 mg | Freq: Once | INTRAMUSCULAR | Status: AC
  Administered 2022-05-13: 25 mg via INTRAVENOUS
  Filled 2022-05-13: qty 1

## 2022-05-13 MED ORDER — PREDNISONE 20 MG PO TABS: 40.0000 mg | ORAL_TABLET | Freq: Every day | ORAL | 0 refills | Status: DC

## 2022-05-13 MED ORDER — FAMOTIDINE 20 MG PO TABS: 20.0000 mg | ORAL_TABLET | Freq: Two times a day (BID) | ORAL | 0 refills | Status: AC

## 2022-05-13 NOTE — Discharge Instructions (Signed)
You were seen today for likely an allergic reaction.  Continue medications as prescribed.  If you develop new or worsening symptoms such as shortness of breath, facial swelling, throat swelling, you should be reevaluated immediately.

## 2022-05-13 NOTE — ED Notes (Signed)
Pt left cards in room. Called pt and husband and left a voicemail with the husband.

## 2023-03-15 ENCOUNTER — Encounter: Payer: Self-pay | Admitting: Neurology

## 2023-04-06 NOTE — Progress Notes (Signed)
Initial neurology clinic note  SERVICE DATE: 04/13/23  Reason for Evaluation: Consultation requested by [name], MD for an opinion regarding ataxia. My final recommendations will be communicated back to the requesting physician by way of shared medical record or letter to requesting physician via [place] mail.  HPI: This is [name], a 72 y.o. right-handed female with a medical history of HTN, HLD, CHF, afib c/b RVR, DVT, chronic back pain, gout who presents to neurology clinic with the chief complaint of imbalance. The patient is alone.  Patient is not sure when her symptoms started. She has noticed worsening imbalance. She will be walking and leaning to one side or the other. She has fallen twice (10/2022 at [employer] in the parking and 12/2022 at [facility] in the parking lot after getting out of car). She did not hurt herself with either fall and was able to continue with activities. When she stands, she can notice feeling off. If she gathers herself, this will pass in a couple of seconds. She normally walks unassisted, but will use a cane when she has a bad case of gout. She has not had an attack for 1 year though.  She denies dizziness and vertigo. She denies clear weakness, but does mention she will sometimes drops things. She sometimes has numbness and tingling in her hands, right more than left. She also occasionally has numbness and tingling in her toes. She endorses significant neck and low back pain.   Patient has a meningioma for which she has seen NSGY, who felt this was incidental and not contributing to her symptoms.  Patient takes benadryl twice daily and OTC Allegra twice daily for itching. Patient takes lasix as needed for leg edema. She has taken it twice this week, but does not think it is helping. She also takes metoprolol 75 mg daily.  The patient denies symptoms suggestive of oculobulbar weakness including diplopia, ptosis, dysphagia, poor saliva control,  dysarthria/dysphonia, impaired mastication, facial weakness/droop.  There are no neuromuscular respiratory weakness symptoms, particularly orthopnea>dyspnea.   The patient does not report symptoms referable to autonomic dysfunction including impaired sweating, heat or cold intolerance, excessive mucosal dryness, gastroparetic early satiety, postprandial abdominal bloating, constipation, or bowel or bladder dyscontrol.   She endorses having poor taste for the last couple of weeks, but has good smell.  She report any constitutional symptoms like fever, night sweats, anorexia or unintentional weight loss.  EtOH use: None  Restrictive diet? No Family history of neuropathy/myopathy/neurologic disease? Not sure  Patient has had an EMG in the past for cervical and lumbar radiculopathy.   MEDICATIONS:  Outpatient Encounter Medications as of 04/13/2023  Medication Sig   allopurinol (ZYLOPRIM) 100 MG tablet    esomeprazole (NEXIUM) 40 MG capsule Take by mouth.   losartan (COZAAR) 100 MG tablet 100 mg.   methimazole (TAPAZOLE) 10 MG tablet Take 1 tablet by mouth daily.   XARELTO 20 MG TABS tablet Take 1 tablet by mouth daily.   diphenhydrAMINE (BENADRYL) 25 MG tablet Take 1 tablet (25 mg total) by mouth every 4 (four) hours as needed for itching or allergies.   famotidine (PEPCID) 20 MG tablet Take 1 tablet (20 mg total) by mouth 2 (two) times daily.   FARXIGA 5 MG TABS tablet TAKE 1 TABLET BY MOUTH EVERY DAY FOR 30 DAYS   furosemide (LASIX) 40 MG tablet Take 1 tablet by mouth daily.   loratadine (CLARITIN) 10 MG tablet Take 1 tablet by mouth daily.  metoprolol succinate (TOPROL-XL) 25 MG 24 hr tablet 75 mg.   [DISCONTINUED] predniSONE (DELTASONE) 20 MG tablet Take 2 tablets (40 mg total) by mouth daily.   No facility-administered encounter medications on file as of 04/13/2023.    PAST MEDICAL HISTORY: History reviewed. No pertinent past medical history.  PAST SURGICAL HISTORY: History  reviewed. No pertinent surgical history.  ALLERGIES: Allergies  Allergen Reactions   Almond Oil Swelling   Gabapentin    Influenza Virus Vaccine    Meloxicam     Other reaction(s): Other (See Comments), Other (see comments) Growths on skin Growths on skin    Shrimp Extract Swelling   Spironolactone Other (See Comments)    Elevated LFTs   Sulfamethoxazole     Other reaction(s): Other (See Comments), Unknown   Trimethoprim     Other reaction(s): Unknown   Codeine Rash    Allscripts Description: Codeine Derivatives 1Allscripts Description: Codeine Derivatives     Sulfamethoxazole-Trimethoprim Rash    Rash around neck and mouth Rash around neck and mouth Rash around neck and mouth Rash around neck and mouth    Tramadol Itching    FAMILY HISTORY: History reviewed. No pertinent family history.  SOCIAL HISTORY: Social History   Tobacco Use   Smoking status: Former    Types: Cigarettes   Smokeless tobacco: Never   Social History   Social History Narrative   Not on file     OBJECTIVE: PHYSICAL EXAM: BP 137/72   Pulse 91   Ht 5\' 6"  (1.676 m)   Wt 206 lb 12.8 oz (93.8 kg)   SpO2 98%   BMI 33.38 kg/m   Orthostatic vitals - borderline (18 SBP drop, 10 DBP drop) Lying 120/78, 80 HR Sitting 128/80, 85 HR Standing 110/70, 83 HR  General: General appearance: Awake and alert. No distress. Cooperative with exam.  Skin: No obvious rash or jaundice. HEENT: Atraumatic. Anicteric. Lungs: Non-labored breathing on room air  Extremities: Bilateral peripheral edema Psych: ?Flat affect.  Neurological: Mental Status: Alert. Speech fluent. No pseudobulbar affect Cranial Nerves: CNII: No RAPD. Visual fields grossly intact. CNIII, IV, VI: PERRL. No nystagmus. EOMI. CN V: Facial sensation intact bilaterally to fine touch.  CN VII: Weakness of orbicularis oris, Equivocal weakness of right orbicular oculi. Ptosis bilaterally, not worse with sustained up gaze. Cogan lid  twitch positive. CN VIII: Hearing grossly intact bilaterally. CN IX: No hypophonia. CN X: Palate elevates symmetrically. CN XI: Full strength shoulder shrug bilaterally. CN XII: Tongue protrusion full and midline. No atrophy or fasciculations. No significant dysarthria Motor: Tone is normal.  Individual muscle group testing (MRC grade out of 5):  Movement     Neck flexion 5    Neck extension 5     Right Left   Shoulder abduction 5 5 No fatigability  Elbow flexion 5 5   Elbow extension 5 5   Finger abduction - FDI 5 5   Finger abduction - ADM 5 5   Finger extension 5 5   Finger distal flexion - 2/3 5 5    Finger distal flexion - 4/5 5 5    Thumb flexion - FPL 5 5   Thumb abduction - APB 5 5-    Hip flexion 5 5   Hip extension 5 5   Hip adduction 5 5   Hip abduction 5 5   Knee extension 5 5   Knee flexion 5 5   Dorsiflexion 5 5   Plantarflexion 5 5    Reflexes:  Right  Left   Bicep 2+ 2+   Tricep 2+ 2+   BrRad 2+ 2+   Knee 2+ 2+   Ankle 2+ 2+    Pathological Reflexes: Babinski: flexor response bilaterally Hoffman: absent bilaterally Troemner: absent bilaterally Sensation: Pinprick: Intact in all extremities Vibration: Mildly diminished in bilateral great toes Proprioception: Intact in bilateral great toes Coordination: Intact finger-to- nose-finger bilaterally. Romberg with mild sway. Gait: Able to rise from chair with arms crossed unassisted. Lightheaded when standing. Narrow-based gait, imbalance when walking. No freezing. No en bloc turns, but more off balance when turning.  Lab and Test Review: External labs: 03/27/23: LFTs: Alk phos 201, AST 50, ALT 61 GGT: 623  11/11/22: ANA negative  TSH (10/01/22): 0.29 CRP wnl ESR 48  CK (08/08/22): 543  Imaging: MRI brain w/wo contrast (11/15/21): FINDINGS: Brain: No substantial change in size of a 13 mm meningioma along the parasagittal left parietal convexity near the vertex allowing for differences in  slice angulation. As before, abuts the superior sagittal sinus without evidence of compression or invasion. Some associated susceptibility probably reflects mineralization.   There is no acute infarction or intracranial hemorrhage. There is no parenchymal mass, significant mass effect, or edema. There is no hydrocephalus or extra-axial fluid collection. Ventricles and sulci are normal in size and configuration. Minimal patchy foci of T2 hyperintensity in the supratentorial white matter likely reflect nonspecific gliosis/demyelination. No new abnormal enhancement.   Vascular: Major vessel flow voids at the skull base are preserved.   Skull and upper cervical spine: Normal marrow signal is preserved.   Sinuses/Orbits: Left maxillary sinus lobular mucosal thickening. Orbits are unremarkable.   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 Appearance is unchanged. Mastoid air cells are clear.   IMPRESSION: No significant change in left parietal convexity meningioma at the vertex. No edema.  CT cervical spine w contrast (02/26/2009): Findings: Lumbar puncture was accomplished without difficulty.  Contrast was maneuvered into the cervical subarachnoid space.  AP,  lateral, and oblique views show mild spondylosis at C6-7 with a  shallow ventral defect.  There is no nerve root cut off and no  spinal stenosis.   ASSESSMENT: Iyani Dresner is a 72 y.o. female who presents for evaluation of imbalance. She has a relevant medical history of HTN, HLD, CHF, afib c/b RVR, DVT, chronic back pain, gout. Her neurological examination is pertinent for ptosis and mild facial muscle weakness. She had mildly diminished sensation to vibration in her feet. Her gait was normal based, but with imbalance, especially with turns. No evidence of parkinsonism. Her orthostatic vitals were borderline for orthostatic hypotension. Available diagnostic data  is significant for MRI brain from 2022 showing a 13 mm meningioma along the parasagittal left parietal convexity near the vertex. This would not explain patient's symptoms.  The etiology of patient's symptoms is unclear, but likely multifactorial. There may be contributions from orthostatic hypotension, possible cervical spine stenosis, or peripheral neuropathy, though with normal reflexes, this would be unusual. Her facial weakness may be a separate issue, but could be concerning for neuromuscular disease such as myasthenia gravis. I will investigate further as below.  PLAN: -Blood work: CK, AChR abs, B1, B12, HbA1c, IFE -MRI cervical spine wo contrast -May consider EMG if other testing is unrevealing -Patient was encouraged to talk to cardiology about orthostatics. She sees them next week.  -Return to clinic in 3 months  The impression above as well as the plan as outlined below  were extensively discussed with the patient who voiced understanding. All questions were answered to their satisfaction.  The patient was counseled on pertinent fall precautions per the printed material provided today, and as noted under the "Patient Instructions" section below.  When available, results of the above investigations and possible further recommendations will be communicated to the patient via telephone/MyChart. Patient to call office if not contacted after expected testing turnaround time.   Total time spent reviewing records, interview, history/exam, documentation, and coordination of care on day of encounter:  65 min   Thank you for allowing me to participate in patient's care.  If I can answer any additional questions, I would be pleased to do so.  Jacquelyne Balint, MD   CC: [name], MD No address on file  CC: Referring provider: [name], MD No address on file

## 2023-04-07 ENCOUNTER — Ambulatory Visit: Payer: Medicare PPO | Admitting: Neurology

## 2023-04-13 ENCOUNTER — Other Ambulatory Visit (INDEPENDENT_AMBULATORY_CARE_PROVIDER_SITE_OTHER): Payer: Medicare PPO

## 2023-04-13 ENCOUNTER — Ambulatory Visit: Payer: Medicare PPO | Admitting: Neurology

## 2023-04-13 ENCOUNTER — Encounter: Payer: Self-pay | Admitting: Neurology

## 2023-04-13 VITALS — BP 137/72 | HR 91 | Ht 66.0 in | Wt 206.8 lb

## 2023-04-13 DIAGNOSIS — H02403 Unspecified ptosis of bilateral eyelids: Secondary | ICD-10-CM

## 2023-04-13 DIAGNOSIS — Z131 Encounter for screening for diabetes mellitus: Secondary | ICD-10-CM

## 2023-04-13 DIAGNOSIS — R269 Unspecified abnormalities of gait and mobility: Secondary | ICD-10-CM | POA: Diagnosis not present

## 2023-04-13 DIAGNOSIS — M4726 Other spondylosis with radiculopathy, lumbar region: Secondary | ICD-10-CM

## 2023-04-13 DIAGNOSIS — M489 Spondylopathy, unspecified: Secondary | ICD-10-CM

## 2023-04-13 DIAGNOSIS — R2689 Other abnormalities of gait and mobility: Secondary | ICD-10-CM

## 2023-04-13 NOTE — Addendum Note (Signed)
Addended by: Lenise Herald on: 04/13/2023 04:21 PM   Modules accepted: Orders

## 2023-04-13 NOTE — Patient Instructions (Addendum)
I would like to investigate your symptoms further with the following: -Blood work today -MRI of your neck (cervical spine). Some one will call you to schedule this. If you do not hear from anyone in 1-2 weeks, let us know  I will be in touch when I have your results to discuss next steps.  Your blood pressure dropped a little when we tested you laying, sitting, and standing. I recommend you speak to your cardiologist about this.  The physicians and staff at [facility] are committed to providing excellent care. You may receive a survey requesting feedback about your experience at our office. We strive to receive "very good" responses to the survey questions. If you feel that your experience would prevent you from giving the office a "very good " response, please contact our office to try to remedy the situation. We may be reached at [phone]. Thank you for taking the time out of your busy day to complete the survey.  Jacquelyne Balint, MD [facility]  Preventing Falls at [facility] are common, often dreaded events in the lives of older people. Aside from the obvious injuries and even death that may result, fall can cause wide-ranging consequences including loss of independence, mental decline, decreased activity and mobility. Younger people are also at risk of falling, especially those with chronic illnesses and fatigue.  Ways to reduce risk for falling Examine diet and medications. Warm foods and alcohol dilate blood vessels, which can lead to dizziness when standing. Sleep aids, antidepressants and pain medications can also increase the likelihood of a fall.  Get a vision exam. Poor vision, cataracts and glaucoma increase the chances of falling.  Check foot gear. Shoes should fit snugly and have a sturdy, nonskid sole and a broad, low heel  Participate in a physician-approved exercise program to build and maintain muscle strength and improve balance and coordination. Programs  that use ankle weights or stretch bands are excellent for muscle-strengthening. Water aerobics programs and low-impact Tai Chi programs have also been shown to improve balance and coordination.  Increase vitamin D intake. Vitamin D improves muscle strength and increases the amount of calcium the body is able to absorb and deposit in bones.  How to prevent falls from common hazards Floors - Remove all loose wires, cords, and throw rugs. Minimize clutter. Make sure rugs are anchored and smooth. Keep furniture in its usual place.  Chairs -- Use chairs with straight backs, armrests and firm seats. Add firm cushions to existing pieces to add height.  Bathroom - Install grab bars and non-skid tape in the tub or shower. Use a bathtub transfer bench or a shower chair with a back support Use an elevated toilet seat and/or safety rails to assist standing from a low surface. Do not use towel racks or bathroom tissue holders to help you stand.  Lighting - Make sure halls, stairways, and entrances are well-lit. Install a night light in your bathroom or hallway. Make sure there is a light switch at the top and bottom of the staircase. Turn lights on if you get up in the middle of the night. Make sure lamps or light switches are within reach of the bed if you have to get up during the night.  Kitchen - Install non-skid rubber mats near the sink and stove. Clean spills immediately. Store frequently used utensils, pots, pans between waist and eye level. This helps prevent reaching and bending. Sit when getting things out of lower cupboards.  Living room/  Bedrooms - Place furniture with wide spaces in between, giving enough room to move around. Establish a route through the living room that gives you something to hold onto as you walk.  Stairs - Make sure treads, rails, and rugs are secure. Install a rail on both sides of the stairs. If stairs are a threat, it might be helpful to arrange most of your activities on  the lower level to reduce the number of times you must climb the stairs.  Entrances and doorways - Install metal handles on the walls adjacent to the doorknobs of all doors to make it more secure as you travel through the doorway.  Tips for maintaining balance Keep at least one hand free at all times. Try using a backpack or fanny pack to hold things rather than carrying them in your hands. Never carry objects in both hands when walking as this interferes with keeping your balance.  Attempt to swing both arms from front to back while walking. This might require a conscious effort if Parkinson's disease has diminished your movement. It will, however, help you to maintain balance and posture, and reduce fatigue.  Consciously lift your feet off of the ground when walking. Shuffling and dragging of the feet is a common culprit in losing your balance.  When trying to navigate turns, use a "U" technique of facing forward and making a wide turn, rather than pivoting sharply.  Try to stand with your feet shoulder-length apart. When your feet are close together for any length of time, you increase your risk of losing your balance and falling.  Do one thing at a time. Don't try to walk and accomplish another task, such as reading or looking around. The decrease in your automatic reflexes complicates motor function, so the less distraction, the better.  Do not wear rubber or gripping soled shoes, they might "catch" on the floor and cause tripping.  Move slowly when changing positions. Use deliberate, concentrated movements and, if needed, use a grab bar or walking aid. Count 15 seconds between each movement. For example, when rising from a seated position, wait 15 seconds after standing to begin walking.  If balance is a continuous problem, you might want to consider a walking aid such as a cane, walking stick, or walker. Once you've mastered walking with help, you might be ready to try it on your own  again.

## 2023-04-14 LAB — EXTRA SPECIMEN

## 2023-04-18 LAB — HEMOGLOBIN A1C
Hgb A1c MFr Bld: 7.4 % of total Hgb — ABNORMAL HIGH (ref <5.7)
Mean Plasma Glucose: 166 mg/dL
eAG (mmol/L): 9.2 mmol/L

## 2023-04-18 LAB — EXTRA SPECIMEN

## 2023-04-18 LAB — IMMUNOFIXATION ELECTROPHORESIS: IgM, Serum: 69 mg/dL (ref 50–300)

## 2023-04-19 LAB — IMMUNOFIXATION ELECTROPHORESIS
IgM, Serum: 69 mg/dL (ref 50–300)
Immunoglobulin A: 138 mg/dL (ref 70–320)

## 2023-04-19 LAB — VITAMIN B12: Vitamin B-12: 1073 pg/mL (ref 200–1100)

## 2023-04-19 LAB — CK: Total CK: 109 U/L (ref 29–143)

## 2023-05-04 LAB — IMMUNOFIXATION ELECTROPHORESIS

## 2023-05-04 LAB — MYASTHENIA GRAVIS PANEL 2
A CHR BINDING ABS: <0.3 nmol/L
ACHR Blocking Abs: <15 % Inhibition (ref <15)
Acetylchol Modul Ab: <1 % Inhibition

## 2023-05-04 LAB — VITAMIN B1

## 2023-06-07 ENCOUNTER — Other Ambulatory Visit: Payer: Medicare PPO

## 2023-07-25 NOTE — Progress Notes (Deleted)
I saw Margaret Jefferson in neurology clinic on 08/02/23 in follow up for imbalance.  HPI: Margaret Jefferson is a 72 y.o. year old female with a history of HTN, HLD, CHF, afib c/b RVR, DVT, chronic back pain, gout who we last saw on 04/13/23.  To briefly review: Patient is not sure when her symptoms started. She has noticed worsening imbalance. She will be walking and leaning to one side or the other. She has fallen twice (10/2022 at [employer] in the parking and 12/2022 at [facility] in the parking lot after getting out of car). She did not hurt herself with either fall and was able to continue with activities. When she stands, she can notice feeling off. If she gathers herself, this will pass in a couple of seconds. She normally walks unassisted, but will use a cane when she has a bad case of gout. She has not had an attack for 1 year though.   She denies dizziness and vertigo. She denies clear weakness, but does mention she will sometimes drops things. She sometimes has numbness and tingling in her hands, right more than left. She also occasionally has numbness and tingling in her toes. She endorses significant neck and low back pain.    Patient has a meningioma for which she has seen NSGY, who felt this was incidental and not contributing to her symptoms.   Patient takes benadryl twice daily and OTC Allegra twice daily for itching. Patient takes lasix as needed for leg edema. She has taken it twice this week, but does not think it is helping. She also takes metoprolol 75 mg daily.   The patient denies symptoms suggestive of oculobulbar weakness including diplopia, ptosis, dysphagia, poor saliva control, dysarthria/dysphonia, impaired mastication, facial weakness/droop.   There are no neuromuscular respiratory weakness symptoms, particularly orthopnea>dyspnea.    The patient does not report symptoms referable to autonomic dysfunction including impaired sweating, heat or cold intolerance, excessive  mucosal dryness, gastroparetic early satiety, postprandial abdominal bloating, constipation, or bowel or bladder dyscontrol.    She endorses having poor taste for the last couple of weeks, but has good smell.   She report any constitutional symptoms like fever, night sweats, anorexia or unintentional weight loss.   EtOH use: None  Restrictive diet? No Family history of neuropathy/myopathy/neurologic disease? Not sure   Patient has had an EMG in the past for cervical and lumbar radiculopathy.  Most recent Assessment and Plan (04/13/23): Margaret Jefferson is a 72 y.o. female who presents for evaluation of imbalance. She has a relevant medical history of HTN, HLD, CHF, afib c/b RVR, DVT, chronic back pain, gout. Her neurological examination is pertinent for ptosis and mild facial muscle weakness. She had mildly diminished sensation to vibration in her feet. Her gait was normal based, but with imbalance, especially with turns. No evidence of parkinsonism. Her orthostatic vitals were borderline for orthostatic hypotension. Available diagnostic data is significant for MRI brain from 2022 showing a 13 mm meningioma along the parasagittal left parietal convexity near the vertex. This would not explain patient's symptoms.   The etiology of patient's symptoms is unclear, but likely multifactorial. There may be contributions from orthostatic hypotension, possible cervical spine stenosis, or peripheral neuropathy, though with normal reflexes, this would be unusual. Her facial weakness may be a separate issue, but could be concerning for neuromuscular disease such as myasthenia gravis. I will investigate further as below.   PLAN: -Blood work: CK, AChR abs, B1, B12, HbA1c, IFE -MRI cervical spine  wo contrast -May consider EMG if other testing is unrevealing -Patient was encouraged to talk to cardiology about orthostatics. She sees them next week.  Since their last visit: Labs were significant for HbA1c of  7.4, but normal B12 and IFE and negative MG panel (AChR abs). B1 could not be tested due to collection error.***  MRI cervical spine has not been completed.***  Did she speak to cardiology about orthostatics?***  ROS: Pertinent positive and negative systems reviewed in HPI. ***   MEDICATIONS:  Outpatient Encounter Medications as of 08/02/2023  Medication Sig   allopurinol (ZYLOPRIM) 100 MG tablet    diphenhydrAMINE (BENADRYL) 25 MG tablet Take 1 tablet (25 mg total) by mouth every 4 (four) hours as needed for itching or allergies.   esomeprazole (NEXIUM) 40 MG capsule Take by mouth.   famotidine (PEPCID) 20 MG tablet Take 1 tablet (20 mg total) by mouth 2 (two) times daily.   FARXIGA 5 MG TABS tablet TAKE 1 TABLET BY MOUTH EVERY DAY FOR 30 DAYS   furosemide (LASIX) 40 MG tablet Take 1 tablet by mouth daily.   loratadine (CLARITIN) 10 MG tablet Take 1 tablet by mouth daily.   losartan (COZAAR) 100 MG tablet 100 mg.   methimazole (TAPAZOLE) 10 MG tablet Take 1 tablet by mouth daily.   metoprolol succinate (TOPROL-XL) 25 MG 24 hr tablet 75 mg.   XARELTO 20 MG TABS tablet Take 1 tablet by mouth daily.   No facility-administered encounter medications on file as of 08/02/2023.    PAST MEDICAL HISTORY: No past medical history on file.  PAST SURGICAL HISTORY: No past surgical history on file.  ALLERGIES: Allergies  Allergen Reactions   Almond Oil Swelling   Gabapentin    Influenza Virus Vaccine    Meloxicam     Other reaction(s): Other (See Comments), Other (see comments) Growths on skin Growths on skin    Shrimp Extract Swelling   Spironolactone Other (See Comments)    Elevated LFTs   Sulfamethoxazole     Other reaction(s): Other (See Comments), Unknown   Trimethoprim     Other reaction(s): Unknown   Codeine Rash    Allscripts Description: Codeine Derivatives 1Allscripts Description: Codeine Derivatives     Sulfamethoxazole-Trimethoprim Rash    Rash around neck and  mouth Rash around neck and mouth Rash around neck and mouth Rash around neck and mouth    Tramadol Itching    FAMILY HISTORY: No family history on file.  SOCIAL HISTORY: Social History   Tobacco Use   Smoking status: Former    Types: Cigarettes   Smokeless tobacco: Never   Social History   Social History Narrative   Not on file    Objective:  Vital Signs:  There were no vitals taken for this visit.  General:*** General appearance: Awake and alert. No distress. Cooperative with exam.  Skin: No obvious rash or jaundice. HEENT: Atraumatic. Anicteric. Lungs: Non-labored breathing on room air  Heart: Regular Abdomen: Soft, non tender. Extremities: No edema. No obvious deformity.  Musculoskeletal: No obvious joint swelling.  Neurological: Mental Status: Alert. Speech fluent. No pseudobulbar affect Cranial Nerves: CNII: No RAPD. Visual fields intact. CNIII, IV, VI: PERRL. No nystagmus. EOMI. CN V: Facial sensation intact bilaterally to fine touch. Masseter clench strong. Jaw jerk***. CN VII: Facial muscles symmetric and strong. No ptosis at rest or after sustained upgaze***. CN VIII: Hears finger rub well bilaterally. CN IX: No hypophonia. CN X: Palate elevates symmetrically. CN XI: Full strength shoulder  shrug bilaterally. CN XII: Tongue protrusion full and midline. No atrophy or fasciculations. No significant dysarthria*** Motor: Tone is ***. *** fasciculations in *** extremities. *** atrophy. No grip or percussive myotonia.  Individual muscle group testing (MRC grade out of 5):  Movement     Neck flexion ***    Neck extension ***     Right Left   Shoulder abduction *** ***   Shoulder adduction *** ***   Shoulder ext rotation *** ***   Shoulder int rotation *** ***   Elbow flexion *** ***   Elbow extension *** ***   Wrist extension *** ***   Wrist flexion *** ***   Finger abduction - FDI *** ***   Finger abduction - ADM *** ***   Finger extension  *** ***   Finger distal flexion - 2/3 *** ***   Finger distal flexion - 4/5 *** ***   Thumb flexion - FPL *** ***   Thumb abduction - APB *** ***    Hip flexion *** ***   Hip extension *** ***   Hip adduction *** ***   Hip abduction *** ***   Knee extension *** ***   Knee flexion *** ***   Dorsiflexion *** ***   Plantarflexion *** ***   Inversion *** ***   Eversion *** ***   Great toe extension *** ***   Great toe flexion *** ***     Reflexes:  Right Left  Bicep *** ***  Tricep *** ***  BrRad *** ***  Knee *** ***  Ankle *** ***   Pathological Reflexes: Babinski: *** response bilaterally*** Hoffman: *** Troemner: *** Pectoral: *** Palmomental: *** Facial: *** Midline tap: *** Sensation: Pinprick: *** Vibration: *** Temperature: *** Proprioception: *** Coordination: Intact finger-to- nose-finger and heel-to-shin bilaterally. Romberg negative.*** Gait: Able to rise from chair with arms crossed unassisted. Normal, narrow-based gait. Able to tandem walk. Able to walk on toes and heels.***   Lab and Test Review: New results: 04/13/23: B12: 1073 IFE: no M protein CK: 109 HbA1c: 7.4 MG panel: AChR abs (binding, blocking, and modulating): negative  Previously reviewed results: 03/27/23: LFTs: Alk phos 201, AST 50, ALT 61 GGT: 623   11/11/22: ANA negative   TSH (10/01/22): 0.29 CRP wnl ESR 48   CK (08/08/22): 543   Imaging: MRI brain w/wo contrast (11/15/21): FINDINGS: Brain: No substantial change in size of a 13 mm meningioma along the parasagittal left parietal convexity near the vertex allowing for differences in slice angulation. As before, abuts the superior sagittal sinus without evidence of compression or invasion. Some associated susceptibility probably reflects mineralization.   There is no acute infarction or intracranial hemorrhage. There is no parenchymal mass, significant mass effect, or edema. There is no hydrocephalus or extra-axial fluid  collection. Ventricles and sulci are normal in size and configuration. Minimal patchy foci of T2 hyperintensity in the supratentorial white matter likely reflect nonspecific gliosis/demyelination. No new abnormal enhancement.   Vascular: Major vessel flow voids at the skull base are preserved.   Skull and upper cervical spine: Normal marrow signal is preserved.   Sinuses/Orbits: Left maxillary sinus lobular mucosal thickening. Orbits are unremarkable.   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 Appearance is unchanged. Mastoid air cells are clear.   IMPRESSION: No significant change in left parietal convexity meningioma at the vertex. No edema.   CT cervical spine w contrast (02/26/2009): Findings: Lumbar puncture was accomplished without difficulty.  Contrast  was maneuvered into the cervical subarachnoid space.  AP,  lateral, and oblique views show mild spondylosis at C6-7 with a  shallow ventral defect.  There is no nerve root cut off and no  spinal stenosis.   ASSESSMENT: This is Margaret Jefferson, a 72 y.o. female with:  ***  Plan: ***  Return to clinic in ***  Total time spent reviewing records, interview, history/exam, documentation, and coordination of care on day of encounter:  *** min  Jacquelyne Balint, MD

## 2023-08-02 ENCOUNTER — Encounter: Payer: Self-pay | Admitting: Neurology

## 2023-08-02 ENCOUNTER — Ambulatory Visit: Payer: Medicare PPO | Admitting: Neurology

## 2023-08-02 DIAGNOSIS — Z029 Encounter for administrative examinations, unspecified: Secondary | ICD-10-CM

## 2023-08-04 ENCOUNTER — Encounter: Payer: Self-pay | Admitting: Neurology

## 2023-10-09 NOTE — Progress Notes (Deleted)
I saw Tanyka Alders in neurology clinic on 10/19/23 in follow up for imbalance.  HPI: Andre Licht is a 72 y.o. year old female with a history of HTN, HLD, CHF, afib c/b RVR, DVT, chronic back pain, gout who we last saw on 04/13/23.  To briefly review: Patient is not sure when her symptoms started. She has noticed worsening imbalance. She will be walking and leaning to one side or the other. She has fallen twice (10/2022 at [employer] in the parking and 12/2022 at [facility] in the parking lot after getting out of car). She did not hurt herself with either fall and was able to continue with activities. When she stands, she can notice feeling off. If she gathers herself, this will pass in a couple of seconds. She normally walks unassisted, but will use a cane when she has a bad case of gout. She has not had an attack for 1 year though.   She denies dizziness and vertigo. She denies clear weakness, but does mention she will sometimes drops things. She sometimes has numbness and tingling in her hands, right more than left. She also occasionally has numbness and tingling in her toes. She endorses significant neck and low back pain.    Patient has a meningioma for which she has seen NSGY, who felt this was incidental and not contributing to her symptoms.   Patient takes benadryl twice daily and OTC Allegra twice daily for itching. Patient takes lasix as needed for leg edema. She has taken it twice this week, but does not think it is helping. She also takes metoprolol 75 mg daily.   The patient denies symptoms suggestive of oculobulbar weakness including diplopia, ptosis, dysphagia, poor saliva control, dysarthria/dysphonia, impaired mastication, facial weakness/droop.   There are no neuromuscular respiratory weakness symptoms, particularly orthopnea>dyspnea.    The patient does not report symptoms referable to autonomic dysfunction including impaired sweating, heat or cold intolerance, excessive  mucosal dryness, gastroparetic early satiety, postprandial abdominal bloating, constipation, or bowel or bladder dyscontrol.    She endorses having poor taste for the last couple of weeks, but has good smell.   She report any constitutional symptoms like fever, night sweats, anorexia or unintentional weight loss.   EtOH use: None  Restrictive diet? No Family history of neuropathy/myopathy/neurologic disease? Not sure   Patient has had an EMG in the past for cervical and lumbar radiculopathy.  Most recent Assessment and Plan (04/13/23): Corda Leiber is a 72 y.o. female who presents for evaluation of imbalance. She has a relevant medical history of HTN, HLD, CHF, afib c/b RVR, DVT, chronic back pain, gout. Her neurological examination is pertinent for ptosis and mild facial muscle weakness. She had mildly diminished sensation to vibration in her feet. Her gait was normal based, but with imbalance, especially with turns. No evidence of parkinsonism. Her orthostatic vitals were borderline for orthostatic hypotension. Available diagnostic data is significant for MRI brain from 2022 showing a 13 mm meningioma along the parasagittal left parietal convexity near the vertex. This would not explain patient's symptoms.   The etiology of patient's symptoms is unclear, but likely multifactorial. There may be contributions from orthostatic hypotension, possible cervical spine stenosis, or peripheral neuropathy, though with normal reflexes, this would be unusual. Her facial weakness may be a separate issue, but could be concerning for neuromuscular disease such as myasthenia gravis. I will investigate further as below.   PLAN: -Blood work: CK, AChR abs, B1, B12, HbA1c, IFE -MRI cervical spine  wo contrast -May consider EMG if other testing is unrevealing -Patient was encouraged to talk to cardiology about orthostatics. She sees them next week.  Since their last visit: Labs were significant for HbA1c of  7.4, but normal B12 and IFE and negative MG panel (AChR abs). B1 could not be tested due to collection error.***  MRI cervical spine has not been completed.***  Did she speak to cardiology about orthostatics?***  ROS: Pertinent positive and negative systems reviewed in HPI. ***   MEDICATIONS:  Outpatient Encounter Medications as of 10/19/2023  Medication Sig   allopurinol (ZYLOPRIM) 100 MG tablet    diphenhydrAMINE (BENADRYL) 25 MG tablet Take 1 tablet (25 mg total) by mouth every 4 (four) hours as needed for itching or allergies.   esomeprazole (NEXIUM) 40 MG capsule Take by mouth.   famotidine (PEPCID) 20 MG tablet Take 1 tablet (20 mg total) by mouth 2 (two) times daily.   FARXIGA 5 MG TABS tablet TAKE 1 TABLET BY MOUTH EVERY DAY FOR 30 DAYS   furosemide (LASIX) 40 MG tablet Take 1 tablet by mouth daily.   loratadine (CLARITIN) 10 MG tablet Take 1 tablet by mouth daily.   losartan (COZAAR) 100 MG tablet 100 mg.   methimazole (TAPAZOLE) 10 MG tablet Take 1 tablet by mouth daily.   metoprolol succinate (TOPROL-XL) 25 MG 24 hr tablet 75 mg.   XARELTO 20 MG TABS tablet Take 1 tablet by mouth daily.   No facility-administered encounter medications on file as of 10/19/2023.    PAST MEDICAL HISTORY: No past medical history on file.  PAST SURGICAL HISTORY: No past surgical history on file.  ALLERGIES: Allergies  Allergen Reactions   Almond Oil Swelling   Gabapentin    Influenza Virus Vaccine    Meloxicam     Other reaction(s): Other (See Comments), Other (see comments) Growths on skin Growths on skin    Shrimp Extract Swelling   Spironolactone Other (See Comments)    Elevated LFTs   Sulfamethoxazole     Other reaction(s): Other (See Comments), Unknown   Trimethoprim     Other reaction(s): Unknown   Codeine Rash    Allscripts Description: Codeine Derivatives 1Allscripts Description: Codeine Derivatives     Sulfamethoxazole-Trimethoprim Rash    Rash around neck and  mouth Rash around neck and mouth Rash around neck and mouth Rash around neck and mouth    Tramadol Itching    FAMILY HISTORY: No family history on file.  SOCIAL HISTORY: Social History   Tobacco Use   Smoking status: Former    Types: Cigarettes   Smokeless tobacco: Never   Social History   Social History Narrative   Not on file    Objective:  Vital Signs:  There were no vitals taken for this visit.  General:*** General appearance: Awake and alert. No distress. Cooperative with exam.  Skin: No obvious rash or jaundice. HEENT: Atraumatic. Anicteric. Lungs: Non-labored breathing on room air  Heart: Regular Abdomen: Soft, non tender. Extremities: No edema. No obvious deformity.  Musculoskeletal: No obvious joint swelling.  Neurological: Mental Status: Alert. Speech fluent. No pseudobulbar affect Cranial Nerves: CNII: No RAPD. Visual fields intact. CNIII, IV, VI: PERRL. No nystagmus. EOMI. CN V: Facial sensation intact bilaterally to fine touch. Masseter clench strong. Jaw jerk***. CN VII: Facial muscles symmetric and strong. No ptosis at rest or after sustained upgaze***. CN VIII: Hears finger rub well bilaterally. CN IX: No hypophonia. CN X: Palate elevates symmetrically. CN XI: Full strength shoulder  shrug bilaterally. CN XII: Tongue protrusion full and midline. No atrophy or fasciculations. No significant dysarthria*** Motor: Tone is ***. *** fasciculations in *** extremities. *** atrophy. No grip or percussive myotonia.  Individual muscle group testing (MRC grade out of 5):  Movement     Neck flexion ***    Neck extension ***     Right Left   Shoulder abduction *** ***   Shoulder adduction *** ***   Shoulder ext rotation *** ***   Shoulder int rotation *** ***   Elbow flexion *** ***   Elbow extension *** ***   Wrist extension *** ***   Wrist flexion *** ***   Finger abduction - FDI *** ***   Finger abduction - ADM *** ***   Finger extension  *** ***   Finger distal flexion - 2/3 *** ***   Finger distal flexion - 4/5 *** ***   Thumb flexion - FPL *** ***   Thumb abduction - APB *** ***    Hip flexion *** ***   Hip extension *** ***   Hip adduction *** ***   Hip abduction *** ***   Knee extension *** ***   Knee flexion *** ***   Dorsiflexion *** ***   Plantarflexion *** ***   Inversion *** ***   Eversion *** ***   Great toe extension *** ***   Great toe flexion *** ***     Reflexes:  Right Left  Bicep *** ***  Tricep *** ***  BrRad *** ***  Knee *** ***  Ankle *** ***   Pathological Reflexes: Babinski: *** response bilaterally*** Hoffman: *** Troemner: *** Pectoral: *** Palmomental: *** Facial: *** Midline tap: *** Sensation: Pinprick: *** Vibration: *** Temperature: *** Proprioception: *** Coordination: Intact finger-to- nose-finger and heel-to-shin bilaterally. Romberg negative.*** Gait: Able to rise from chair with arms crossed unassisted. Normal, narrow-based gait. Able to tandem walk. Able to walk on toes and heels.***   Lab and Test Review: New results: 04/13/23: B12: 1073 IFE: no M protein CK: 109 HbA1c: 7.4 MG panel: AChR abs (binding, blocking, and modulating): negative  Previously reviewed results: 03/27/23: LFTs: Alk phos 201, AST 50, ALT 61 GGT: 623   11/11/22: ANA negative   TSH (10/01/22): 0.29 CRP wnl ESR 48   CK (08/08/22): 543   Imaging: MRI brain w/wo contrast (11/15/21): FINDINGS: Brain: No substantial change in size of a 13 mm meningioma along the parasagittal left parietal convexity near the vertex allowing for differences in slice angulation. As before, abuts the superior sagittal sinus without evidence of compression or invasion. Some associated susceptibility probably reflects mineralization.   There is no acute infarction or intracranial hemorrhage. There is no parenchymal mass, significant mass effect, or edema. There is no hydrocephalus or extra-axial fluid  collection. Ventricles and sulci are normal in size and configuration. Minimal patchy foci of T2 hyperintensity in the supratentorial white matter likely reflect nonspecific gliosis/demyelination. No new abnormal enhancement.   Vascular: Major vessel flow voids at the skull base are preserved.   Skull and upper cervical spine: Normal marrow signal is preserved.   Sinuses/Orbits: Left maxillary sinus lobular mucosal thickening. Orbits are unremarkable.   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 Appearance is unchanged. Mastoid air cells are clear.   IMPRESSION: No significant change in left parietal convexity meningioma at the vertex. No edema.   CT cervical spine w contrast (02/26/2009): Findings: Lumbar puncture was accomplished without difficulty.  Contrast  was maneuvered into the cervical subarachnoid space.  AP,  lateral, and oblique views show mild spondylosis at C6-7 with a  shallow ventral defect.  There is no nerve root cut off and no  spinal stenosis.   ASSESSMENT: This is Quatasia Pintado, a 72 y.o. female with:  ***  Plan: ***  Return to clinic in ***  Total time spent reviewing records, interview, history/exam, documentation, and coordination of care on day of encounter:  *** min  Jacquelyne Balint, MD

## 2023-10-13 ENCOUNTER — Ambulatory Visit: Payer: Medicare PPO | Admitting: Neurology

## 2023-10-19 ENCOUNTER — Ambulatory Visit: Payer: Medicare PPO | Admitting: Neurology

## 2024-01-30 NOTE — Progress Notes (Signed)
 I saw Margaret Jefferson in neurology clinic on 02/09/24 in follow up for imbalance.  HPI: Margaret Jefferson is a 73 y.o. year old female with a history of HTN, HLD, CHF, afib c/b RVR, DVT, chronic back pain, gout who we last saw on 04/13/23.  To briefly review: Patient is not sure when her symptoms started. She has noticed worsening imbalance. She will be walking and leaning to one side or the other. She has fallen twice (10/2022 at [employer] in the parking and 12/2022 at [facility] in the parking lot after getting out of car). She did not hurt herself with either fall and was able to continue with activities. When she stands, she can notice feeling off. If she gathers herself, this will pass in a couple of seconds. She normally walks unassisted, but will use a cane when she has a bad case of gout. She has not had an attack for 1 year though.   She denies dizziness and vertigo. She denies clear weakness, but does mention she will sometimes drops things. She sometimes has numbness and tingling in her hands, right more than left. She also occasionally has numbness and tingling in her toes. She endorses significant neck and low back pain.    Patient has a meningioma for which she has seen NSGY, who felt this was incidental and not contributing to her symptoms.   Patient takes benadryl twice daily and OTC Allegra twice daily for itching. Patient takes lasix as needed for leg edema. She has taken it twice this week, but does not think it is helping. She also takes metoprolol 75 mg daily.   The patient denies symptoms suggestive of oculobulbar weakness including diplopia, ptosis, dysphagia, poor saliva control, dysarthria/dysphonia, impaired mastication, facial weakness/droop.   There are no neuromuscular respiratory weakness symptoms, particularly orthopnea>dyspnea.    The patient does not report symptoms referable to autonomic dysfunction including impaired sweating, heat or cold intolerance, excessive  mucosal dryness, gastroparetic early satiety, postprandial abdominal bloating, constipation, or bowel or bladder dyscontrol.    She endorses having poor taste for the last couple of weeks, but has good smell.   She report any constitutional symptoms like fever, night sweats, anorexia or unintentional weight loss.   EtOH use: None  Restrictive diet? No Family history of neuropathy/myopathy/neurologic disease? Not sure   Patient has had an EMG in the past for cervical and lumbar radiculopathy.  Most recent Assessment and Plan (04/13/23): Margaret Jefferson is a 73 y.o. female who presents for evaluation of imbalance. She has a relevant medical history of HTN, HLD, CHF, afib c/b RVR, DVT, chronic back pain, gout. Her neurological examination is pertinent for ptosis and mild facial muscle weakness. She had mildly diminished sensation to vibration in her feet. Her gait was normal based, but with imbalance, especially with turns. No evidence of parkinsonism. Her orthostatic vitals were borderline for orthostatic hypotension. Available diagnostic data is significant for MRI brain from 2022 showing a 13 mm meningioma along the parasagittal left parietal convexity near the vertex. This would not explain patient's symptoms.   The etiology of patient's symptoms is unclear, but likely multifactorial. There may be contributions from orthostatic hypotension, possible cervical spine stenosis, or peripheral neuropathy, though with normal reflexes, this would be unusual. Her facial weakness may be a separate issue, but could be concerning for neuromuscular disease such as myasthenia gravis. I will investigate further as below.   PLAN: -Blood work: CK, AChR abs, B1, B12, HbA1c, IFE -MRI cervical spine  wo contrast -May consider EMG if other testing is unrevealing -Patient was encouraged to talk to cardiology about orthostatics. She sees them next week.  Since their last visit: Patient got an ear infection in  12/2023. She was sent to ENT and told she has fluid around her ear. She is going to [facility] soon for further testing. Per notes from Dr. Manson Passey in ENT on 12/29/23: Assessment/Plan: Margaret Jefferson is a 73 y.o. female patient with bilateral mild sensorineural hearing loss, intermittent popping sensation of left ear in the setting of a finding on the left side with a CT Head. 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 Recommended obtaining an updated MRI Brain and a CT Temporal Bone for further workup of the finding on her recent CT Head. Will order a CT Temporal Bone and MRI Brain. Will plan on obtaining the imaging of her prior MRI Brain for personal review. She will follow up after obtaining the imaging for discussion of the results or sooner with any issues.   Labs were significant for HbA1c of 7.4, but normal B12 and IFE and negative MG panel (AChR abs). B1 could not be tested due to collection error.   MRI cervical spine has not been completed due to all other things patient has going on.  She has not had any recent falls. She still has occasional imbalance, particularly when turning.  Her ptosis is stable. She denies any difficulty with diplopia, chewing, swallowing, breathing, weakness of arms or legs.   MEDICATIONS:  Outpatient Encounter Medications as of 02/09/2024  Medication Sig   allopurinol (ZYLOPRIM) 100 MG tablet Take 100 mg by mouth 3 (three) times daily.   diphenhydrAMINE (BENADRYL) 25 MG tablet Take 1 tablet (25 mg total) by mouth every 4 (four) hours as needed for itching or allergies.   esomeprazole (NEXIUM) 40 MG capsule Take 40 mg by mouth daily.   famotidine (PEPCID) 20 MG tablet Take 1 tablet (20 mg total) by mouth 2 (two) times daily.   furosemide (LASIX) 40 MG tablet Take 1 tablet by mouth as needed.   loratadine (CLARITIN) 10 MG tablet Take 1 tablet  by mouth daily.   losartan (COZAAR) 100 MG tablet Take 75 mg by mouth daily.   methimazole (TAPAZOLE) 10 MG tablet Take 1 tablet by mouth daily.   metoprolol succinate (TOPROL-XL) 25 MG 24 hr tablet 75 mg.   XARELTO 20 MG TABS tablet Take 1 tablet by mouth daily.   FARXIGA 5 MG TABS tablet TAKE 1 TABLET BY MOUTH EVERY DAY FOR 30 DAYS (Patient not taking: Reported on 02/09/2024)   STEGLATRO 5 MG TABS tablet Take 5 mg by mouth daily.   No facility-administered encounter medications on file as of 02/09/2024.    PAST MEDICAL HISTORY: History reviewed. No pertinent past medical history.  PAST SURGICAL HISTORY: History reviewed. No pertinent surgical history.  ALLERGIES: Allergies  Allergen Reactions   Almond Oil Swelling   Gabapentin    Influenza Virus Vaccine    Meloxicam     Other reaction(s): Other (See Comments), Other (see comments) Growths on skin Growths on skin    Shrimp Extract Swelling   Spironolactone Other (See Comments)    Elevated LFTs   Sulfamethoxazole     Other reaction(s): Other (See Comments), Unknown   Trimethoprim     Other reaction(s): Unknown   Codeine Rash  Allscripts Description: Codeine Derivatives 1Allscripts Description: Codeine Derivatives     Sulfamethoxazole-Trimethoprim Rash    Rash around neck and mouth Rash around neck and mouth Rash around neck and mouth Rash around neck and mouth    Tramadol Itching    FAMILY HISTORY: Family History  Problem Relation Age of Onset   Hip fracture Mother    Tracheoesophageal fistual Mother    Colon cancer Father     SOCIAL HISTORY: Social History   Tobacco Use   Smoking status: Former    Types: Cigarettes   Smokeless tobacco: Never   Tobacco comments:    Quit in 2000  Vaping Use   Vaping status: Never Used  Substance Use Topics   Alcohol use: Not Currently   Drug use: Not Currently   Social History   Social History Narrative   Are you right handed or left handed? Right   Are you  currently employed ?    What is your current occupation?retired   Do you live at home alone? no   Who lives with you? family   What type of home do you live in: 1 story or 2 story? one    Caffiene rarely    Objective:  Vital Signs:  BP (!) 176/80   Pulse 96   Ht 5\' 6"  (1.676 m)   Wt 196 lb (88.9 kg)   SpO2 98%   BMI 31.64 kg/m   General: General appearance: Awake and alert. No distress. Cooperative with exam.  Skin: No obvious rash or jaundice. HEENT: Atraumatic. Anicteric. Lungs: Non-labored breathing on room air  Extremities: No edema. No obvious deformity.  Musculoskeletal: No obvious joint swelling.  Neurological: Mental Status: Alert. Speech fluent. No pseudobulbar affect Cranial Nerves: CNII: No RAPD. Visual fields intact. CNIII, IV, VI: PERRL. No nystagmus. EOMI. Head impulse testing with horizontal correction and feeling of dizziness. CN V: Facial sensation intact bilaterally to fine touch. CN VII: Facial muscles symmetric and strong. No ptosis at rest. CN VIII: Hears finger rub well bilaterally. CN IX: No hypophonia. CN X: Palate elevates symmetrically. CN XI: Full strength shoulder shrug bilaterally. CN XII: Tongue protrusion full and midline. No atrophy or fasciculations. No significant dysarthria Motor: Tone is normal. No tremor. Strength 5/5 in bilateral upper and lower extremities Reflexes:  Right Left  Bicep 2+ 2+  Tricep 2+ 2+  BrRad 2+ 2+  Knee 2+ 2+  Ankle 2+ 2+   Sensation: Light touch sensation intact Coordination: Intact finger-to- nose-finger and heel-to-shin bilaterally. Romberg negative. Gait: Able to rise from chair with arms crossed unassisted. Normal, narrow-based gait. Slow, short steps.   Lab and Test Review: New results: 10/30/23 (external): IFE: no M protein CBC unremarkable Vit D wnl  04/13/23: B12: 1073 IFE: no M protein CK: 109 HbA1c: 7.4 MG panel: AChR abs (binding, blocking, and modulating): negative   Previously  reviewed results: 03/27/23: LFTs: Alk phos 201, AST 50, ALT 61 GGT: 623   11/11/22: ANA negative   TSH (10/01/22): 0.29 CRP wnl ESR 48   CK (08/08/22): 543   MRI brain w/wo contrast (11/15/21): FINDINGS: Brain: No substantial change in size of a 13 mm meningioma along the parasagittal left parietal convexity near the vertex allowing for differences in slice angulation. As before, abuts the superior sagittal sinus without evidence of compression or invasion. Some associated susceptibility probably reflects mineralization.   There is no acute infarction or intracranial hemorrhage. There is no parenchymal mass, significant mass effect, or edema. There is no  hydrocephalus or extra-axial fluid collection. Ventricles and sulci are normal in size and configuration. Minimal patchy foci of T2 hyperintensity in the supratentorial white matter likely reflect nonspecific gliosis/demyelination. No new abnormal enhancement.   Vascular: Major vessel flow voids at the skull base are preserved.   Skull and upper cervical spine: Normal marrow signal is preserved.   Sinuses/Orbits: Left maxillary sinus lobular mucosal thickening. Orbits are unremarkable.   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 Appearance is unchanged. Mastoid air cells are clear.   IMPRESSION: No significant change in left parietal convexity meningioma at the vertex. No edema.   CT cervical spine w contrast (02/26/2009): Findings: Lumbar puncture was accomplished without difficulty.  Contrast was maneuvered into the cervical subarachnoid space.  AP,  lateral, and oblique views show mild spondylosis at C6-7 with a  shallow ventral defect.  There is no nerve root cut off and no  spinal stenosis.   ASSESSMENT: This is Margaret Jefferson, a 73 y.o. female with imbalance. Her examination shows bilateral ptosis (negative MG panel) and mildly diminished sensation  in her feet. Her gait is slow with short steps. There is no freezing or clear bradykinesia, tremor, or increased tone to suggest parkinsonism currently. MRI brain from 2022 showed a 13 mm meningioma along the parasagittal left parietal convexity near the vertex. This would not explain patient's symptoms. 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 Inner ear pathology could potentially explain her symptoms, if this is found with her planned work up. Cervical stenosis is another potential etiology given her neck pain.  Plan: -Agree with plan of repeat MRI brain and CT temporal bone scan and work up by ENT -Will consider MRI cervical spine with ENT work up does not find etiology or symptoms persist. -Fall precautions discussed  Return to clinic in 6 months  Total time spent reviewing records, interview, history/exam, documentation, and coordination of care on day of encounter:  30 min  Jacquelyne Balint, MD

## 2024-02-09 ENCOUNTER — Encounter: Payer: Self-pay | Admitting: Neurology

## 2024-02-09 ENCOUNTER — Ambulatory Visit (INDEPENDENT_AMBULATORY_CARE_PROVIDER_SITE_OTHER): Payer: Medicare PPO | Admitting: Neurology

## 2024-02-09 VITALS — BP 148/72 | HR 96 | Ht 66.0 in | Wt 196.0 lb

## 2024-02-09 DIAGNOSIS — H02403 Unspecified ptosis of bilateral eyelids: Secondary | ICD-10-CM | POA: Diagnosis not present

## 2024-02-09 DIAGNOSIS — R2689 Other abnormalities of gait and mobility: Secondary | ICD-10-CM

## 2024-02-09 DIAGNOSIS — R269 Unspecified abnormalities of gait and mobility: Secondary | ICD-10-CM

## 2024-02-09 DIAGNOSIS — M489 Spondylopathy, unspecified: Secondary | ICD-10-CM | POA: Diagnosis not present

## 2024-02-09 DIAGNOSIS — M4726 Other spondylosis with radiculopathy, lumbar region: Secondary | ICD-10-CM

## 2024-02-09 NOTE — Patient Instructions (Signed)
 I want you to get the work up and follow up with ENT as planned. An inner ear problem could be behind your imbalance.  If this does not help or find anything, we will likely get the MRI of your neck we previously talked about.  I will see you back in clinic in about 6 months after ENT has a chance to work on this. Please let me know if you have any questions or concerns in the meantime.  The physicians and staff at [facility] are committed to providing excellent care. You may receive a survey requesting feedback about your experience at our office. We strive to receive "very good" responses to the survey questions. If you feel that your experience would prevent you from giving the office a "very good " response, please contact our office to try to remedy the situation. We may be reached at [phone]. Thank you for taking the time out of your busy day to complete the survey.  Jacquelyne Balint, MD  Neurology  Preventing Falls at [facility] are common, often dreaded events in the lives of older people. Aside from the obvious injuries and even death that may result, fall can cause wide-ranging consequences including loss of independence, mental decline, decreased activity and mobility. Younger people are also at risk of falling, especially those with chronic illnesses and fatigue.  Ways to reduce risk for falling Examine diet and medications. Warm foods and alcohol dilate blood vessels, which can lead to dizziness when standing. Sleep aids, antidepressants and pain medications can also increase the likelihood of a fall.  Get a vision exam. Poor vision, cataracts and glaucoma increase the chances of falling.  Check foot gear. Shoes should fit snugly and have a sturdy, nonskid sole and a broad, low heel  Participate in a physician-approved exercise program to build and maintain muscle strength and improve balance and coordination. Programs that use ankle weights or stretch bands are  excellent for muscle-strengthening. Water aerobics programs and low-impact Tai Chi programs have also been shown to improve balance and coordination.  Increase vitamin D intake. Vitamin D improves muscle strength and increases the amount of calcium the body is able to absorb and deposit in bones.  How to prevent falls from common hazards Floors - Remove all loose wires, cords, and throw rugs. Minimize clutter. Make sure rugs are anchored and smooth. Keep furniture in its usual place.  Chairs -- Use chairs with straight backs, armrests and firm seats. Add firm cushions to existing pieces to add height.  Bathroom - Install grab bars and non-skid tape in the tub or shower. Use a bathtub transfer bench or a shower chair with a back support Use an elevated toilet seat and/or safety rails to assist standing from a low surface. Do not use towel racks or bathroom tissue holders to help you stand.  Lighting - Make sure halls, stairways, and entrances are well-lit. Install a night light in your bathroom or hallway. Make sure there is a light switch at the top and bottom of the staircase. Turn lights on if you get up in the middle of the night. Make sure lamps or light switches are within reach of the bed if you have to get up during the night.  Kitchen - Install non-skid rubber mats near the sink and stove. Clean spills immediately. Store frequently used utensils, pots, pans between waist and eye level. This helps prevent reaching and bending. Sit when getting things out of lower cupboards.  Living  room/ Bedrooms - Place furniture with wide spaces in between, giving enough room to move around. Establish a route through the living room that gives you something to hold onto as you walk.  Stairs - Make sure treads, rails, and rugs are secure. Install a rail on both sides of the stairs. If stairs are a threat, it might be helpful to arrange most of your activities on the lower level to reduce the number of times  you must climb the stairs.  Entrances and doorways - Install metal handles on the walls adjacent to the doorknobs of all doors to make it more secure as you travel through the doorway.  Tips for maintaining balance Keep at least one hand free at all times. Try using a backpack or fanny pack to hold things rather than carrying them in your hands. Never carry objects in both hands when walking as this interferes with keeping your balance.  Attempt to swing both arms from front to back while walking. This might require a conscious effort if Parkinson's disease has diminished your movement. It will, however, help you to maintain balance and posture, and reduce fatigue.  Consciously lift your feet off of the ground when walking. Shuffling and dragging of the feet is a common culprit in losing your balance.  When trying to navigate turns, use a "U" technique of facing forward and making a wide turn, rather than pivoting sharply.  Try to stand with your feet shoulder-length apart. When your feet are close together for any length of time, you increase your risk of losing your balance and falling.  Do one thing at a time. Don't try to walk and accomplish another task, such as reading or looking around. The decrease in your automatic reflexes complicates motor function, so the less distraction, the better.  Do not wear rubber or gripping soled shoes, they might "catch" on the floor and cause tripping.  Move slowly when changing positions. Use deliberate, concentrated movements and, if needed, use a grab bar or walking aid. Count 15 seconds between each movement. For example, when rising from a seated position, wait 15 seconds after standing to begin walking.  If balance is a continuous problem, you might want to consider a walking aid such as a cane, walking stick, or walker. Once you've mastered walking with help, you might be ready to try it on your own again.

## 2024-08-01 NOTE — Progress Notes (Deleted)
 I saw Margaret Jefferson in neurology clinic on 08/09/24 in follow up for imbalance.  HPI: Margaret Jefferson is a 73 y.o. year old female with a history of HTN, HLD, CHF, afib c/b RVR, DVT, chronic back pain, gout who we last saw on 02/09/24.  To briefly review: 04/13/23: Patient is not sure when her symptoms started. She has noticed worsening imbalance. She will be walking and leaning to one side or the other. She has fallen twice (10/2022 at [employer] in the parking and 12/2022 at [facility] in the parking lot after getting out of car). She did not hurt herself with either fall and was able to continue with activities. When she stands, she can notice feeling off. If she gathers herself, this will pass in a couple of seconds. She normally walks unassisted, but will use a cane when she has a bad case of gout. She has not had an attack for 1 year though.   She denies dizziness and vertigo. She denies clear weakness, but does mention she will sometimes drops things. She sometimes has numbness and tingling in her hands, right more than left. She also occasionally has numbness and tingling in her toes. She endorses significant neck and low back pain.    Patient has a meningioma for which she has seen NSGY, who felt this was incidental and not contributing to her symptoms.   Patient takes benadryl  twice daily and OTC Allegra twice daily for itching. Patient takes lasix as needed for leg edema. She has taken it twice this week, but does not think it is helping. She also takes metoprolol 75 mg daily.   The patient denies symptoms suggestive of oculobulbar weakness including diplopia, ptosis, dysphagia, poor saliva control, dysarthria/dysphonia, impaired mastication, facial weakness/droop.   There are no neuromuscular respiratory weakness symptoms, particularly orthopnea>dyspnea.    The patient does not report symptoms referable to autonomic dysfunction including impaired sweating, heat or cold intolerance,  excessive mucosal dryness, gastroparetic early satiety, postprandial abdominal bloating, constipation, or bowel or bladder dyscontrol.    She endorses having poor taste for the last couple of weeks, but has good smell.   She report any constitutional symptoms like fever, night sweats, anorexia or unintentional weight loss.   EtOH use: None  Restrictive diet? No Family history of neuropathy/myopathy/neurologic disease? Not sure   Patient has had an EMG in the past for cervical and lumbar radiculopathy.  02/09/24: Patient got an ear infection in 12/2023. She was sent to ENT and told she has fluid around her ear. She is going to [facility] soon for further testing. Per notes from Margaret Jefferson in ENT on 12/29/23: Assessment/Plan: Margaret Jefferson is a 73 y.o. female patient with bilateral mild sensorineural hearing loss, intermittent popping sensation of left ear in the setting of a finding on the left side with a CT Head. 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 Recommended obtaining an updated MRI Brain and a CT Temporal Bone for further workup of the finding on her recent CT Head. Will order a CT Temporal Bone and MRI Brain. Will plan on obtaining the imaging of her prior MRI Brain for personal review. She will follow up after obtaining the imaging for discussion of the results or sooner with any issues.    Labs were significant for HbA1c of 7.4, but normal B12 and IFE and negative MG panel (AChR  abs). B1 could not be tested due to collection error.   MRI cervical spine has not been completed due to all other things patient has going on.   She has not had any recent falls. She still has occasional imbalance, particularly when turning.   Her ptosis is stable. She denies any difficulty with diplopia, chewing, swallowing, breathing, weakness of arms or legs.  Most recent  Assessment and Plan (02/09/24): This is Margaret Jefferson, a 73 y.o. female with imbalance. Her examination shows bilateral ptosis (negative MG panel) and mildly diminished sensation in her feet. Her gait is slow with short steps. There is no freezing or clear bradykinesia, tremor, or increased tone to suggest parkinsonism currently. MRI brain from 2022 showed a 13 mm meningioma along the parasagittal left parietal convexity near the vertex. This would not explain patient's symptoms. 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 Inner ear pathology could potentially explain her symptoms, if this is found with her planned work up. Cervical stenosis is another potential etiology given her neck pain.   Plan: -Agree with plan of repeat MRI brain and CT temporal bone scan and work up by ENT -Will consider MRI cervical spine with ENT work up does not find etiology or symptoms persist. -Fall precautions discussed  Since their last visit: ***ENT testing and MRIs***Can't see ENT notes  Imbalance?***  ROS: Pertinent positive and negative systems reviewed in HPI. ***   MEDICATIONS:  Outpatient Encounter Medications as of 08/09/2024  Medication Sig   allopurinol (ZYLOPRIM) 100 MG tablet Take 100 mg by mouth 3 (three) times daily.   diphenhydrAMINE  (BENADRYL ) 25 MG tablet Take 1 tablet (25 mg total) by mouth every 4 (four) hours as needed for itching or allergies.   esomeprazole (NEXIUM) 40 MG capsule Take 40 mg by mouth daily.   famotidine  (PEPCID ) 20 MG tablet Take 1 tablet (20 mg total) by mouth 2 (two) times daily.   FARXIGA 5 MG TABS tablet TAKE 1 TABLET BY MOUTH EVERY DAY FOR 30 DAYS (Patient not taking: Reported on 02/09/2024)   furosemide (LASIX) 40 MG tablet Take 1 tablet by mouth as needed.   loratadine (CLARITIN) 10 MG tablet Take 1 tablet by mouth daily.   losartan (COZAAR) 100 MG tablet Take 75 mg by mouth daily.   methimazole  (TAPAZOLE) 10 MG tablet Take 1 tablet by mouth daily.   metoprolol succinate (TOPROL-XL) 25 MG 24 hr tablet 75 mg.   STEGLATRO 5 MG TABS tablet Take 5 mg by mouth daily.   XARELTO 20 MG TABS tablet Take 1 tablet by mouth daily.   No facility-administered encounter medications on file as of 08/09/2024.    PAST MEDICAL HISTORY: No past medical history on file.  PAST SURGICAL HISTORY: No past surgical history on file.  ALLERGIES: Allergies  Allergen Reactions   Almond Oil Swelling   Gabapentin    Influenza Virus Vaccine    Meloxicam     Other reaction(s): Other (See Comments), Other (see comments) Growths on skin Growths on skin    Shrimp Extract Swelling   Spironolactone Other (See Comments)    Elevated LFTs   Sulfamethoxazole     Other reaction(s): Other (See Comments), Unknown   Trimethoprim     Other reaction(s): Unknown   Codeine Rash    Allscripts Description: Codeine Derivatives 1Allscripts Description: Codeine Derivatives     Sulfamethoxazole-Trimethoprim Rash    Rash around neck and mouth Rash around neck and  mouth Rash around neck and mouth Rash around neck and mouth    Tramadol Itching    FAMILY HISTORY: Family History  Problem Relation Age of Onset   Hip fracture Mother    Tracheoesophageal fistual Mother    Colon cancer Father     SOCIAL HISTORY: Social History   Tobacco Use   Smoking status: Former    Types: Cigarettes   Smokeless tobacco: Never   Tobacco comments:    Quit in 2000  Vaping Use   Vaping status: Never Used  Substance Use Topics   Alcohol use: Not Currently   Drug use: Not Currently   Social History   Social History Narrative   Are you right handed or left handed? Right   Are you currently employed ?    What is your current occupation?retired   Do you live at home alone? no   Who lives with you? family   What type of home do you live in: 1 story or 2 story? one    Caffiene rarely    Objective:  Vital Signs:   There were no vitals taken for this visit.  General:*** General appearance: Awake and alert. No distress. Cooperative with exam.  Skin: No obvious rash or jaundice. HEENT: Atraumatic. Anicteric. Lungs: Non-labored breathing on room air  Heart: Regular Abdomen: Soft, non tender. Extremities: No edema. No obvious deformity.  Musculoskeletal: No obvious joint swelling.  Neurological: Mental Status: Alert. Speech fluent. No pseudobulbar affect Cranial Nerves: CNII: No RAPD. Visual fields intact. CNIII, IV, VI: PERRL. No nystagmus. EOMI. CN V: Facial sensation intact bilaterally to fine touch. Masseter clench strong. Jaw jerk***. CN VII: Facial muscles symmetric and strong. No ptosis at rest or after sustained upgaze***. CN VIII: Hears finger rub well bilaterally. CN IX: No hypophonia. CN X: Palate elevates symmetrically. CN XI: Full strength shoulder shrug bilaterally. CN XII: Tongue protrusion full and midline. No atrophy or fasciculations. No significant dysarthria*** Motor: Tone is ***. *** fasciculations in *** extremities. *** atrophy. No grip or percussive myotonia.  Individual muscle group testing (MRC grade out of 5):  Movement     Neck flexion ***    Neck extension ***     Right Left   Shoulder abduction *** ***   Shoulder adduction *** ***   Shoulder ext rotation *** ***   Shoulder int rotation *** ***   Elbow flexion *** ***   Elbow extension *** ***   Wrist extension *** ***   Wrist flexion *** ***   Finger abduction - FDI *** ***   Finger abduction - ADM *** ***   Finger extension *** ***   Finger distal flexion - 2/3 *** ***   Finger distal flexion - 4/5 *** ***   Thumb flexion - FPL *** ***   Thumb abduction - APB *** ***    Hip flexion *** ***   Hip extension *** ***   Hip adduction *** ***   Hip abduction *** ***   Knee extension *** ***   Knee flexion *** ***   Dorsiflexion *** ***   Plantarflexion *** ***   Inversion *** ***   Eversion  *** ***   Great toe extension *** ***   Great toe flexion *** ***     Reflexes:  Right Left  Bicep *** ***  Tricep *** ***  BrRad *** ***  Knee *** ***  Ankle *** ***   Pathological Reflexes: Babinski: *** response bilaterally*** Hoffman: *** Troemner: *** Pectoral: ***  Palmomental: *** Facial: *** Midline tap: *** Sensation: Pinprick: *** Vibration: *** Temperature: *** Proprioception: *** Coordination: Intact finger-to- nose-finger and heel-to-shin bilaterally. Romberg negative.*** Gait: Able to rise from chair with arms crossed unassisted. Normal, narrow-based gait. Able to tandem walk. Able to walk on toes and heels.***   Lab and Test Review: New results: External labs (03/02/24): CBC w/ diff unremarkable BMP significant for Na 134, K 3.3, Cr 1.33  CT temporal bones wo contrast (02/23/24): Similar appearance of lucent lesion and bony remodeling involving the left petrous apex, clivus and carotid canal compatible with cephalocele as described on concurrent MRI brain.   Improved left mastoid effusion. The left middle ear cavity is normal.   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 The left V3 branch appears laterally displaced within Meckel's cave. Petrous apex lesion demonstrates low intrinsic signal on T1-weighted imaging.   Partially empty appearance of the sella, which may reflect a normal variant in this patient but may also be seen in the setting of idiopathic intracranial hypertension. Prominent arachnoid granulations mildly narrows the right distal transverse and proximal sigmoid dural venous sinuses. Bilateral proptosis.   1.4 x 1.4 x 1.2 cm durally based enhancing mass with suggested ossified  component along the left aspect of the posterior falx cerebri and posterior calvarial convexity (15:107, 17:192). Slight mass effect on the left posterior parietal lobe. Lesion abuts but does not definitely invade the superior sagittal sinus.   RIGHT: No evidence of cerebellopontine angle mass or internal auditory canal lesion.  There are four cranial nerves within the internal auditory canals. Normal morphology of the inner ear structures. No abnormal enhancement. No restricted diffusion.   LEFT: No evidence of cerebellopontine angle mass or internal auditory canal lesion.  There are four cranial nerves within the internal auditory canals. Normal morphology of the inner ear structures. No abnormal enhancement. No restricted diffusion. Partial left mastoid effusion.   Ventricles are normal in size. No midline shift. There is no acute intracranial hemorrhage or acute infarct. Left maxillary sinus mucous retention cyst.   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   --1.4 cm durally based lesion along the left posterior convexity and falx, favored to represent a meningioma.   --Signs of elevated intracranial pressure with expanded and empty appearance of the sella turcica and bilateral proptosis. Suggested right dural venous sinus narrowing by prominent arachnoid granulations.   --Normal appearance of the internal auditory canals and inner ear structures.   CT temporal bones/posterior fossa w/o contrast (03/03/23): 1.  Lucent lesion along the left clivus near the left petrous apex, grossly unchanged in size from 10/13/2023. This lesion has been characterized as a cephalocele on an outside hospital MRI from 02/23/2024. Possible dehiscence is seen along the medial aspects of this lesion, with possible communication with the sphenoid sinus and/or sella. Sella is noted to be expanded. Additionally, dehiscence is also seen along the wall abutting the  petrous ICA on the left. Recommend comparison with outside temporal bone CT on 02/23/2024. Patient is also known to the [facility] ENT service, and can be assessed for CSF leak.  2.  Partial left mastoid effusion. No middle ear opacities.  3.  No intracranial hemorrhage or mass effect.   CT head wo  contrast and temporal bones/posterior fossa w/o contrast (03/02/24): 1.  Lucent lesion along the left clivus near the left petrous apex, grossly unchanged in size from 10/13/2023. This lesion has been characterized as a cephalocele on an outside hospital MRI from 02/23/2024. Possible dehiscence is seen along the medial aspects of this lesion, with possible communication with the sphenoid sinus and/or sella. Sella is noted to be expanded. Additionally, dehiscence is also seen along the wall abutting the petrous ICA on the left. Recommend comparison with outside temporal bone CT on 02/23/2024. Patient is also known to the [facility] ENT service, and can be assessed for CSF leak.  2.  Partial left mastoid effusion. No middle ear opacities.  3.  No intracranial hemorrhage or mass effect.   Previously reviewed results: 10/30/23 (external): IFE: no M protein CBC unremarkable Vit D wnl   04/13/23: B12: 1073 IFE: no M protein CK: 109 HbA1c: 7.4 MG panel: AChR abs (binding, blocking, and modulating): negative   03/27/23: LFTs: Alk phos 201, AST 50, ALT 61 GGT: 623   11/11/22: ANA negative   TSH (10/01/22): 0.29 CRP wnl ESR 48   CK (08/08/22): 543   MRI brain w/wo contrast (11/15/21): FINDINGS: Brain: No substantial change in size of a 13 mm meningioma along the parasagittal left parietal convexity near the vertex allowing for differences in slice angulation. As before, abuts the superior sagittal sinus without evidence of compression or invasion. Some associated susceptibility probably reflects mineralization.   There is no acute infarction or intracranial hemorrhage. There is no parenchymal mass, significant  mass effect, or edema. There is no hydrocephalus or extra-axial fluid collection. Ventricles and sulci are normal in size and configuration. Minimal patchy foci of T2 hyperintensity in the supratentorial white matter likely reflect nonspecific gliosis/demyelination. No new abnormal enhancement.   Vascular: Major vessel flow voids at the skull base are preserved.   Skull and upper cervical spine: Normal marrow signal is preserved.   Sinuses/Orbits: Left maxillary sinus lobular mucosal thickening. Orbits are unremarkable.   Other: Expanded, empty sella. 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 Appearance is unchanged. Mastoid air cells are clear.   IMPRESSION: No significant change in left parietal convexity meningioma at the vertex. No edema.   CT cervical spine w contrast (02/26/2009): Findings: Lumbar puncture was accomplished without difficulty.  Contrast was maneuvered into the cervical subarachnoid space.  AP,  lateral, and oblique views show mild spondylosis at C6-7 with a  shallow ventral defect.  There is no nerve root cut off and no  spinal stenosis.   ASSESSMENT: This is Margaret Jefferson, a 73 y.o. female with:  ***  Plan: ***  Return to clinic in ***  Total time spent reviewing records, interview, history/exam, documentation, and coordination of care on day of encounter:  *** min  Venetia Potters, MD

## 2024-08-09 ENCOUNTER — Ambulatory Visit: Payer: Medicare PPO | Admitting: Neurology
